# Patient Record
Sex: Female | Born: 1970 | Race: Black or African American | Hispanic: No | Marital: Married | Smoking: Former smoker
Health system: Southern US, Community
[De-identification: ages and names within clinical notes are randomized; demographics above are authoritative.]

## PROBLEM LIST (undated history)

## (undated) ENCOUNTER — Ambulatory Visit

## (undated) DIAGNOSIS — R569 Unspecified convulsions: Secondary | ICD-10-CM

## (undated) DIAGNOSIS — J302 Other seasonal allergic rhinitis: Secondary | ICD-10-CM

## (undated) HISTORY — DX: Other seasonal allergic rhinitis: J30.2

## (undated) HISTORY — PX: CHOLECYSTECTOMY: SHX55

---

## 2000-10-02 ENCOUNTER — Emergency Department (HOSPITAL_COMMUNITY): Admission: EM | Admit: 2000-10-02 | Discharge: 2000-10-02 | Payer: Self-pay | Admitting: Emergency Medicine

## 2009-10-23 ENCOUNTER — Emergency Department (HOSPITAL_COMMUNITY): Admission: EM | Admit: 2009-10-23 | Discharge: 2009-10-23 | Payer: Self-pay | Admitting: Emergency Medicine

## 2011-02-24 LAB — POCT I-STAT, CHEM 8
BUN: 16 mg/dL (ref 6–23)
Chloride: 108 mEq/L (ref 96–112)
Creatinine, Ser: 1.1 mg/dL (ref 0.4–1.2)
Glucose, Bld: 74 mg/dL (ref 70–99)
HCT: 37 % (ref 36.0–46.0)
Potassium: 3.5 mEq/L (ref 3.5–5.1)

## 2011-02-24 LAB — URINALYSIS, ROUTINE W REFLEX MICROSCOPIC
Bilirubin Urine: NEGATIVE
Hgb urine dipstick: NEGATIVE
Specific Gravity, Urine: 1.028 (ref 1.005–1.030)
pH: 6.5 (ref 5.0–8.0)

## 2020-09-12 ENCOUNTER — Other Ambulatory Visit: Payer: Self-pay

## 2020-09-12 ENCOUNTER — Encounter (HOSPITAL_COMMUNITY): Payer: Self-pay | Admitting: Emergency Medicine

## 2020-09-12 ENCOUNTER — Emergency Department (HOSPITAL_COMMUNITY): Payer: Self-pay

## 2020-09-12 ENCOUNTER — Emergency Department (HOSPITAL_COMMUNITY)
Admission: EM | Admit: 2020-09-12 | Discharge: 2020-09-12 | Disposition: A | Discharge status: Home / Self Care | Payer: Self-pay | Attending: Emergency Medicine | Admitting: Emergency Medicine

## 2020-09-12 DIAGNOSIS — R202 Paresthesia of skin: Secondary | ICD-10-CM | POA: Insufficient documentation

## 2020-09-12 DIAGNOSIS — M79604 Pain in right leg: Secondary | ICD-10-CM | POA: Insufficient documentation

## 2020-09-12 DIAGNOSIS — R42 Dizziness and giddiness: Secondary | ICD-10-CM | POA: Insufficient documentation

## 2020-09-12 DIAGNOSIS — H539 Unspecified visual disturbance: Secondary | ICD-10-CM | POA: Insufficient documentation

## 2020-09-12 DIAGNOSIS — Z87891 Personal history of nicotine dependence: Secondary | ICD-10-CM | POA: Insufficient documentation

## 2020-09-12 HISTORY — DX: Unspecified convulsions: R56.9

## 2020-09-12 LAB — COMPREHENSIVE METABOLIC PANEL
ALT: 13 U/L (ref 0–44)
AST: 26 U/L (ref 15–41)
Albumin: 3.1 g/dL — ABNORMAL LOW (ref 3.5–5.0)
Alkaline Phosphatase: 52 U/L (ref 38–126)
Anion gap: 11 (ref 5–15)
BUN: 17 mg/dL (ref 6–20)
CO2: 21 mmol/L — ABNORMAL LOW (ref 22–32)
Calcium: 8.6 mg/dL — ABNORMAL LOW (ref 8.9–10.3)
Chloride: 106 mmol/L (ref 98–111)
Creatinine, Ser: 1.01 mg/dL — ABNORMAL HIGH (ref 0.44–1.00)
GFR, Estimated: >60 mL/min (ref >60)
Glucose, Bld: 108 mg/dL — ABNORMAL HIGH (ref 70–99)
Potassium: 4.4 mmol/L (ref 3.5–5.1)
Sodium: 138 mmol/L (ref 135–145)
Total Bilirubin: 0.6 mg/dL (ref 0.3–1.2)
Total Protein: 6.7 g/dL (ref 6.5–8.1)

## 2020-09-12 LAB — CBC WITH DIFFERENTIAL/PLATELET
Abs Immature Granulocytes: 0.03 10*3/uL (ref 0.00–0.07)
Basophils Absolute: 0 10*3/uL (ref 0.0–0.1)
Basophils Relative: 1 %
Eosinophils Absolute: 0.2 10*3/uL (ref 0.0–0.5)
Eosinophils Relative: 4 %
HCT: 34.1 % — ABNORMAL LOW (ref 36.0–46.0)
Hemoglobin: 10.4 g/dL — ABNORMAL LOW (ref 12.0–15.0)
Immature Granulocytes: 1 %
Lymphocytes Relative: 26 %
Lymphs Abs: 1.4 10*3/uL (ref 0.7–4.0)
MCH: 25.1 pg — ABNORMAL LOW (ref 26.0–34.0)
MCHC: 30.5 g/dL (ref 30.0–36.0)
MCV: 82.4 fL (ref 80.0–100.0)
Monocytes Absolute: 0.7 10*3/uL (ref 0.1–1.0)
Monocytes Relative: 13 %
Neutro Abs: 3.1 10*3/uL (ref 1.7–7.7)
Neutrophils Relative %: 55 %
Platelets: 295 10*3/uL (ref 150–400)
RBC: 4.14 MIL/uL (ref 3.87–5.11)
RDW: 14.6 % (ref 11.5–15.5)
WBC: 5.5 10*3/uL (ref 4.0–10.5)
nRBC: 0 % (ref 0.0–0.2)

## 2020-09-12 LAB — POC URINE PREG, ED: Preg Test, Ur: NEGATIVE

## 2020-09-12 LAB — POC OCCULT BLOOD, ED: Fecal Occult Bld: NEGATIVE

## 2020-09-12 NOTE — ED Triage Notes (Signed)
Pt states she is having numbness on her right leg and dizziness for a month not getting better.

## 2020-09-12 NOTE — ED Notes (Signed)
Dc instructions reviewed with pt. Pt verbalized understanding. Pt DC. 

## 2020-09-12 NOTE — Discharge Instructions (Addendum)
Please follow up with PCP (as instructed) ASAP for referral to Neurology and ophthalmology as discussed.

## 2020-09-12 NOTE — Progress Notes (Signed)
   09/12/20 1840  TOC ED Mini Assessment  TOC Time spent with patient (minutes): 15  PING Used in TOC Assessment Yes  Admission or Readmission Diverted Yes  Interventions which prevented an admission or readmission PCP Appointment Scheduled  ED CM met with patient at bedside to discuss follow up care and establishing primary care.  Patient reports not having health insurance. Discussed [facility] patient is agreeable to receive care at the clinic, CM will forward information to the [facility] at the Clinic to follow up with patient.

## 2020-09-12 NOTE — ED Provider Notes (Signed)
[facility] EMERGENCY DEPARTMENT Provider Note   CSN: [id number] Arrival date & time: 09/12/20  1241     History Chief Complaint  Patient presents with  . Numbness  . Dizziness    Jennifer Spencer is a 49 y.o. female.  The history is provided by the patient.  Dizziness Quality:  Lightheadedness Severity:  Moderate Onset quality:  Sudden Duration: 2 times in last month. Timing:  Intermittent Progression:  Unable to specify Chronicity:  New Relieved by:  Being still Associated symptoms: vision changes   Associated symptoms: no diarrhea, no nausea, no shortness of breath, no vomiting and no weakness   Associated symptoms comment:  R eye vision worsening Leg Pain Location:  Leg Leg location:  R upper leg Pain details:    Quality:  Tingling   Radiates to:  Does not radiate   Severity:  Mild   Onset quality:  Sudden   Timing:  Intermittent Chronicity:  New Associated symptoms: no back pain, no fever, no muscle weakness and no neck pain   Risk factors: obesity        Past Medical History:  Diagnosis Date  . Seizures ([place])     There are no problems to display for this patient.   History reviewed. No pertinent surgical history.   OB History   No obstetric history on file.     No family history on file.  Social History   Tobacco Use  . Smoking status: Former Research scientist (life sciences)  . Smokeless tobacco: Never Used  Substance Use Topics  . Alcohol use: Not Currently  . Drug use: Not Currently    Home Medications Prior to Admission medications   Not on File    Allergies    Codeine, Depakote [divalproex sodium], Penicillins, and Vicodin [hydrocodone-acetaminophen]  Review of Systems   Review of Systems  Constitutional: Negative for fever.  HENT: Negative for sore throat and trouble swallowing.   Eyes: Negative for visual disturbance.  Respiratory: Negative for shortness of breath.   Gastrointestinal: Negative for diarrhea, nausea and  vomiting.  Musculoskeletal: Negative for back pain and neck pain.  Skin: Negative for color change.  Neurological: Positive for dizziness. Negative for weakness.  All other systems reviewed and are negative.   Physical Exam Updated Vital Signs BP 127/67 (BP Location: Right Arm)   Pulse 65   Temp 98.2 F (36.8 C) (Oral)   Resp 16   SpO2 99%   Physical Exam Vitals reviewed.  Constitutional:      General: She is not in acute distress.    Appearance: She is obese.  HENT:     Head: Normocephalic and atraumatic.     Nose: Nose normal.     Mouth/Throat:     Mouth: Mucous membranes are moist.  Eyes:     Comments: Visual acuity R 20/200 and  L 20/40  Cardiovascular:     Rate and Rhythm: Normal rate.     Heart sounds: Normal heart sounds.  Pulmonary:     Effort: Pulmonary effort is normal.     Breath sounds: Normal breath sounds.  Abdominal:     General: Abdomen is flat.     Palpations: Abdomen is soft.     Tenderness: There is no abdominal tenderness.  Musculoskeletal:        General: No swelling, tenderness or signs of injury.     Cervical back: Neck supple.     Right lower leg: No edema.     Left lower leg:  No edema.  Skin:    General: Skin is warm and dry.  Neurological:     Mental Status: She is alert.     Gait: Gait normal.  Psychiatric:        Mood and Affect: Mood normal.        Behavior: Behavior normal.     ED Results / Procedures / Treatments   Labs (all labs ordered are listed, but only abnormal results are displayed) Labs Reviewed  CBC WITH DIFFERENTIAL/PLATELET - Abnormal; Notable for the following components:      Result Value   Hemoglobin 10.4 (*)    HCT 34.1 (*)    MCH 25.1 (*)    All other components within normal limits  COMPREHENSIVE METABOLIC PANEL - Abnormal; Notable for the following components:   CO2 21 (*)    Glucose, Bld 108 (*)    Creatinine, Ser 1.01 (*)    Calcium 8.6 (*)    Albumin 3.1 (*)    All other components within normal  limits  OCCULT BLOOD X 1 CARD TO LAB, STOOL  POC URINE PREG, ED  POC OCCULT BLOOD, ED    EKG EKG Interpretation  Date/Time:  Thursday September 12 2020 15:38:54 EDT Ventricular Rate:  59 PR Interval:    QRS Duration: 99 QT Interval:  403 QTC Calculation: 400 R Axis:   30 Text Interpretation: Sinus rhythm Artifact Low voltage QRS Abnormal ECG Confirmed by Carmin Muskrat [phone]) on 09/12/2020 3:47:28 PM   Radiology CT Head Wo Contrast  Result Date: 09/12/2020 CLINICAL DATA:  Vision loss.  Numbness in right leg. EXAM: CT HEAD WITHOUT CONTRAST TECHNIQUE: Contiguous axial images were obtained from the base of the skull through the vertex without intravenous contrast. COMPARISON:  None. FINDINGS: Brain: No acute intracranial abnormality. Specifically, no hemorrhage, hydrocephalus, mass lesion, acute infarction, or significant intracranial injury. Vascular: No hyperdense vessel or unexpected calcification. Skull: No acute calvarial abnormality. Sinuses/Orbits: Visualized paranasal sinuses and mastoids clear. Orbital soft tissues unremarkable. Other: None IMPRESSION: Normal study. Electronically Signed   By: Rolm Baptise M.D.   On: 09/12/2020 17:42    Procedures Procedures (including critical care time)  Medications Ordered in ED Medications - No data to display  ED Course  I have reviewed the triage vital signs and the nursing notes.  Pertinent labs & imaging results that were available during my care of the patient were reviewed by me and considered in my medical decision making (see chart for details).    MDM Rules/Calculators/A&P                           Medical Decision Making: Jennifer Spencer is a 49 y.o. female who presented to the ED today with R leg numbness and tingling as well as intermittent dizziness/lightheadedness occurring 2 times in the last month associated with new R visual change. Pt reports intermittent R leg pain, "falling asleep sensation" that occurs  after prolonged sitting or standing and improves with position adjustment. No associated back pain, bowel/bladder changes, weakness, or swelling. Pt also notes 2 episodes of lightheadedness with darkening of vision and syncope earlier this month, no identifiable trigger, no associated CP or SOB, episode resolved quickly with rest. Dizziness does not occur other times. Pt reports increased activity and more busy planning her wedding and that the leg pain is slowing her down. Pt additionally notes 1 month of worsened R vision, she describes her vision as converging on itself  and reports she feels like it started around the same time as her first dizziness episode.   Past medical history significant for seizures Reviewed and confirmed nursing documentation for past medical history, family history, social history.  Consults: none performed All radiology and laboratory studies reviewed independently and with my attending physician, agree with reading provided by radiologist unless otherwise noted.  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 Likely in setting of obesity or tight clothing, blood glucose 101 today, no hx diabetes, however high risk with fam hx and weight, advised PCP follow up for HGA1c. Urine preg negative, Pt denies back pain, No red flag back pain symptoms or neurologic deficits. Pt advised to wear loose clothing, continue current weight loss, and use tylenol and ibuprofen as needed. Durward Fortes PCP follow up for dx testing and tx options.  Pt with intermittent dizziness, no SOB or CP, normal electrolytes and EKG.  Mild anemia compared to prior, Hgb 10.4 (from 12.6 in 2010). Pt reports she has a hx of very heavy periods with 7 days or bleeding and 4 days or "super plus" tampons use for high volume bleeding.  FOB negative, pt denies bright red blood or melena in stool.  Pt with 1 month of visual changes in R eye, CT head obtained  unremarkable.  Will refer  to PCP for close referral to ophthalmology and neurology for outpt follow up.  Pt has not seen a PCP in 6 years, advised pt of the importance of establishing PCP follow up for regular preventative health care and monitoring  (HgA1c for DM screening, colonoscopy, pap, etc).    Upon reassessing patient, patient was in NAD, comfortable resting in bed.  Based on the above findings, I believe patient is hemodynamically stable for discharge.  Dispo:  Patient/and family educated about specific return precautions for given chief complaint and symptoms.  Patient/and family educated about follow-up with PCP and given referral to ophthalmologist .  Patient/and family expressed understanding of return precautions and need for follow-up.  Patient discharged.  The above care was discussed with and agreed upon by my attending physician.   Emergency Department Medication Summary:  Medications - No data to display     Final Clinical Impression(s) / ED Diagnoses Final diagnoses:  Paresthesia  Change in vision  Dizziness    Rx / DC Orders ED Discharge Orders    None       Roosevelt Locks, MD 09/12/20 2222    Carmin Muskrat, MD 09/12/20 2323

## 2020-09-26 NOTE — Progress Notes (Deleted)
Patient ID: Jennifer Spencer, female   DOB: [birth year], 49 y.o.   MRN: [id number]   After being seen in the ED 10/21 with leg numbness and vision changes.   From ED A/P: Medical Decision Making: Jennifer Spencer is a 49 y.o. female who presented to the ED today with R leg numbness and tingling as well as intermittent dizziness/lightheadedness occurring 2 times in the last month associated with new R visual change. Pt reports intermittent R leg pain, "falling asleep sensation" that occurs after prolonged sitting or standing and improves with position adjustment. No associated back pain, bowel/bladder changes, weakness, or swelling. Pt also notes 2 episodes of lightheadedness with darkening of vision and syncope earlier this month, no identifiable trigger, no associated CP or SOB, episode resolved quickly with rest. Dizziness does not occur other times. Pt reports increased activity and more busy planning her wedding and that the leg pain is slowing her down. Pt additionally notes 1 month of worsened R vision, she describes her vision as converging on itself and reports she feels like it started around the same time as her first dizziness episode.   Past medical history significant for seizures Reviewed and confirmed nursing documentation for past medical history, family history, social history.  Consults: none performed All radiology and laboratory studies reviewed independently and with my attending physician, agree with reading provided by radiologist unless otherwise noted.  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 Likely in setting of obesity or tight clothing, blood glucose 101 today, no hx diabetes, however high risk with fam hx and weight, advised PCP follow up for HGA1c. Urine preg negative, Pt denies back pain, No red flag back pain symptoms or neurologic deficits. Pt advised to wear loose clothing, continue current weight loss, and use tylenol  and ibuprofen as needed. Durward Fortes PCP follow up for dx testing and tx options.  Pt with intermittent dizziness, no SOB or CP, normal electrolytes and EKG.  Mild anemia compared to prior, Hgb 10.4 (from 12.6 in 2010). Pt reports she has a hx of very heavy periods with 7 days or bleeding and 4 days or "super plus" tampons use for high volume bleeding.  FOB negative, pt denies bright red blood or melena in stool.  Pt with 1 month of visual changes in R eye, CT head obtained  unremarkable.  Will refer to PCP for close referral to ophthalmology and neurology for outpt follow up.  Pt has not seen a PCP in 6 years, advised pt of the importance of establishing PCP follow up for regular preventative health care and monitoring  (HgA1c for DM screening, colonoscopy, pap, etc).    Upon reassessing patient, patient was in NAD, comfortable resting in bed.  Based on the above findings, I believe patient is hemodynamically stable for discharge.  Dispo:  Patient/and family educated about specific return precautions for given chief complaint and symptoms.  Patient/and family educated about follow-up with PCP and given referral to ophthalmologist .  Patient/and family expressed understanding of return precautions and need for follow-up.  Patient discharged.

## 2020-10-02 ENCOUNTER — Inpatient Hospital Stay: Payer: Medicaid Other | Admitting: Physician Assistant

## 2020-10-09 NOTE — Progress Notes (Deleted)
Patient ID: Jennifer Spencer, female   DOB: Jul 22, 1971, 49 y.o.   MRN: [id number]   After ED visit 12/14/2019 for several issues.   From A/P: Medical Decision Making: [name] is a 49 y.o. female who presented to the ED today with R leg numbness and tingling as well as intermittent dizziness/lightheadedness occurring 2 times in the last month associated with new R visual change. Pt reports intermittent R leg pain, "falling asleep sensation" that occurs after prolonged sitting or standing and improves with position adjustment. No associated back pain, bowel/bladder changes, weakness, or swelling. Pt also notes 2 episodes of lightheadedness with darkening of vision and syncope earlier this month, no identifiable trigger, no associated CP or SOB, episode resolved quickly with rest. Dizziness does not occur other times. Pt reports increased activity and more busy planning her wedding and that the leg pain is slowing her down. Pt additionally notes 1 month of worsened R vision, she describes her vision as converging on itself and reports she feels like it started around the same time as her first dizziness episode.   Past medical history significant for seizures Reviewed and confirmed nursing documentation for past medical history, family history, social history.  Consults: none performed All radiology and laboratory studies reviewed independently and with my attending physician, agree with reading provided by radiologist unless otherwise noted.  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 Likely in setting of obesity or tight clothing, blood glucose 101 today, no hx diabetes, however high risk with fam hx and weight, advised PCP follow up for HGA1c. Urine preg negative, Pt denies back pain, No red flag back pain symptoms or neurologic deficits. Pt advised to wear loose clothing, continue current weight loss, and use tylenol and ibuprofen as needed. Durward Fortes PCP follow up for dx testing and tx options.  Pt with intermittent dizziness, no SOB or CP, normal electrolytes and EKG.  Mild anemia compared to prior, Hgb 10.4 (from 12.6 in 2010). Pt reports she has a hx of very heavy periods with 7 days or bleeding and 4 days or "super plus" tampons use for high volume bleeding.  FOB negative, pt denies bright red blood or melena in stool.  Pt with 1 month of visual changes in R eye, CT head obtained  unremarkable.  Will refer to PCP for close referral to ophthalmology and neurology for outpt follow up.  Pt has not seen a PCP in 6 years, advised pt of the importance of establishing PCP follow up for regular preventative health care and monitoring  (HgA1c for DM screening, colonoscopy, pap, etc).

## 2020-10-16 ENCOUNTER — Inpatient Hospital Stay: Payer: Medicaid Other | Admitting: Physician Assistant

## 2020-11-13 NOTE — Progress Notes (Signed)
Patient ID: Jennifer Spencer, female   DOB: [birth year], 49 y.o.   MRN: [id number]     Jennifer Spencer, is a 49 y.o. female  [id number]  [id number]  DOB - [birth year]  Subjective:  Chief Complaint and HPI: Jennifer Spencer is a 49 y.o. female here today to establish care and for a follow up visit ED 09/12/20 for multiple issues.  Her R leg tingling has improved.  She is still working on weight loss.  There is no weight on her in Epic other than today.  She says 11/2019 she weighed 326.  She does admit to drinking 2-3 sodas or teas a day.  She doesn't "like water."  Dizziness is much better.  Vision improved with readers but she wants to see an eye doctor about prescription glasses.  She has never had colonoscopy.  Overdue for pap.  Applying for financial assistance.  Not on iron.  Still has periods monthly that are heavy.    From A/P: Clinical picture or R leg tingling is most consistent with meralgia paresthetica. Likely in setting of obesity or tight clothing, blood glucose 101 today, no hx diabetes, however high risk with fam hx and weight, advised PCP follow up for HGA1c. Urine preg negative, Pt denies back pain, No red flag back pain symptoms or neurologic deficits. Pt advised to wear loose clothing, continue current weight loss, and use tylenol and ibuprofen as needed. Algis Downs. Advised PCP follow up for dx testing and tx options.  Pt with intermittent dizziness, no SOB or CP, normal electrolytes and EKG.  Mild anemia compared to prior, Hgb 10.4 (from 12.6 in 2010). Pt reports she has a hx of very heavy periods with 7 days or bleeding and 4 days or "super plus" tampons use for high volume bleeding.  FOB negative, pt denies bright red blood or melena in stool.  Pt with 1 month of visual changes in R eye, CT head obtained  unremarkable.  Will refer to PCP for close referral to ophthalmology and neurology for outpt follow up.  Pt has not seen a PCP in 6 years, advised pt of the importance  of establishing PCP follow up for regular preventative health care and monitoring  (HgA1c for DM screening, colonoscopy, pap, etc).    Upon reassessing patient, patient was in NAD, comfortable resting in bed.  Based on the above findings, I believe patient is hemodynamically stable for discharge.  Dispo:  Patient/and family educated about specific return precautions for given chief complaint and symptoms.  Patient/and family educated about follow-up with PCP and given referral to ophthalmologist .  Patient/and family expressed    ED/Hospital notes reviewed.   Social:  engaged  ROS:   Constitutional:  No f/c, No night sweats, No unexplained weight loss. EENT:  No vision changes, No blurry vision, No hearing changes. No mouth, throat, or ear problems.  Respiratory: No cough, No SOB Cardiac: No CP, no palpitations GI:  No abd pain, No N/V/D. GU: No Urinary s/sx Musculoskeletal: No joint pain Neuro: No headache, much improved dizziness, no motor weakness.  Skin: No rash Endocrine:  No polydipsia. No polyuria.  Psych: Denies SI/HI  No problems updated.  ALLERGIES: Allergies  Allergen Reactions  . Allegra [Fexofenadine]   . Aspirin   . Bactrim [Sulfamethoxazole-Trimethoprim]   . Codeine   . Depakote [Divalproex Sodium]   . Mushroom Extract Complex   . Penicillins   . Percocet [Oxycodone-Acetaminophen]   . Tequin [Gatifloxacin]   . Vicodin [Hydrocodone-Acetaminophen]  PAST MEDICAL HISTORY: Past Medical History:  Diagnosis Date  . Seizures ([place])     MEDICATIONS AT HOME: Prior to Admission medications   Not on File     Objective:  EXAM:   Vitals:   11/19/20 1030  BP: 121/74  Pulse: 73  Temp: 98.9 F (37.2 C)  TempSrc: Oral  SpO2: 98%  Weight: 278 lb (126.1 kg)  Height: 5\' 7"  (1.702 m)    General appearance : A&OX3. NAD. Non-toxic-appearing HEENT: Atraumatic and Normocephalic.  PERRLA. EOM intact.   Chest/Lungs:  Breathing-non-labored, Good air  entry bilaterally, breath sounds normal without rales, rhonchi, or wheezing  CVS: S1 S2 regular, no murmurs, gallops, rubs  Extremities: Bilateral Lower Ext shows no edema, both legs are warm to touch with = pulse throughout Neurology:  CN II-XII grossly intact, Non focal.   Psych:  TP linear. J/I fair. Normal speech. Appropriate eye contact and affect.  Skin:  No Rash  Data Review No results found for: HGBA1C   Assessment & Plan   1. Hyperglycemia I have had a lengthy discussion and provided education about insulin resistance and the intake of too much sugar/refined carbohydrates.  I have advised the patient to work at a goal of eliminating sugary drinks, candy, desserts, sweets, refined sugars, processed foods, and white carbohydrates.  The patient expresses understanding.  - Hemoglobin A1c  2. Anemia, unspecified type - CBC with Differential/Platelet - Ambulatory referral to Gastroenterology - Iron, TIBC and Ferritin Panel  3. Screening for colon cancer - Ambulatory referral to Gastroenterology  4. Encounter for examination following treatment at hospital Much improved  5. Screening cholesterol level - Lipid panel  6. Vision changes -improved with readers - Ambulatory referral to Ophthalmology  7. Body aches Much improved - Vitamin D, 25-hydroxy     Patient have been counseled extensively about nutrition and exercise  Return in about 6 weeks (around 12/31/2020) for assign to PCP.  The patient was given clear instructions to go to ER or return to medical center if symptoms don't improve, worsen or new problems develop. The patient verbalized understanding. The patient was told to call to get lab results if they haven't heard anything in the next week.     Freeman Caldron, PA-C [facility] [place], [place]   11/19/2020, 11:44 AM

## 2020-11-19 ENCOUNTER — Encounter: Payer: Self-pay | Admitting: Physician Assistant

## 2020-11-19 ENCOUNTER — Ambulatory Visit: Payer: Self-pay | Attending: Physician Assistant | Admitting: Physician Assistant

## 2020-11-19 ENCOUNTER — Other Ambulatory Visit: Payer: Self-pay

## 2020-11-19 VITALS — BP 121/74 | HR 73 | Temp 98.9°F | Ht 67.0 in | Wt 278.0 lb

## 2020-11-19 DIAGNOSIS — D649 Anemia, unspecified: Secondary | ICD-10-CM

## 2020-11-19 DIAGNOSIS — Z13220 Encounter for screening for lipoid disorders: Secondary | ICD-10-CM

## 2020-11-19 DIAGNOSIS — Z09 Encounter for follow-up examination after completed treatment for conditions other than malignant neoplasm: Secondary | ICD-10-CM

## 2020-11-19 DIAGNOSIS — Z1211 Encounter for screening for malignant neoplasm of colon: Secondary | ICD-10-CM

## 2020-11-19 DIAGNOSIS — H539 Unspecified visual disturbance: Secondary | ICD-10-CM

## 2020-11-19 DIAGNOSIS — R52 Pain, unspecified: Secondary | ICD-10-CM

## 2020-11-19 DIAGNOSIS — R739 Hyperglycemia, unspecified: Secondary | ICD-10-CM

## 2020-11-19 NOTE — Patient Instructions (Signed)
Drink 80-100 ounces water daily 

## 2020-11-20 ENCOUNTER — Other Ambulatory Visit: Payer: Self-pay | Admitting: Physician Assistant

## 2020-11-20 LAB — HEMOGLOBIN A1C
Est. average glucose Bld gHb Est-mCnc: 114 mg/dL
Hgb A1c MFr Bld: 5.6 % (ref 4.8–5.6)

## 2020-11-20 LAB — CBC WITH DIFFERENTIAL/PLATELET
Basophils Absolute: 0 10*3/uL (ref 0.0–0.2)
Basos: 0 %
EOS (ABSOLUTE): 0.2 10*3/uL (ref 0.0–0.4)
Eos: 3 %
Hematocrit: 34.2 % (ref 34.0–46.6)
Hemoglobin: 10.9 g/dL — ABNORMAL LOW (ref 11.1–15.9)
Immature Grans (Abs): 0 10*3/uL (ref 0.0–0.1)
Immature Granulocytes: 0 %
Lymphocytes Absolute: 1.9 10*3/uL (ref 0.7–3.1)
Lymphs: 26 %
MCH: 24.9 pg — ABNORMAL LOW (ref 26.6–33.0)
MCHC: 31.9 g/dL (ref 31.5–35.7)
MCV: 78 fL — ABNORMAL LOW (ref 79–97)
Monocytes Absolute: 0.9 10*3/uL (ref 0.1–0.9)
Monocytes: 12 %
Neutrophils Absolute: 4 10*3/uL (ref 1.4–7.0)
Neutrophils: 59 %
Platelets: 333 10*3/uL (ref 150–450)
RBC: 4.37 x10E6/uL (ref 3.77–5.28)
RDW: 15.5 % — ABNORMAL HIGH (ref 11.7–15.4)
WBC: 7 10*3/uL (ref 3.4–10.8)

## 2020-11-20 LAB — IRON,TIBC AND FERRITIN PANEL
Ferritin: 18 ng/mL (ref 15–150)
Iron Saturation: 36 % (ref 15–55)
Iron: 132 ug/dL (ref 27–159)
Total Iron Binding Capacity: 362 ug/dL (ref 250–450)
UIBC: 230 ug/dL (ref 131–425)

## 2020-11-20 LAB — LIPID PANEL
Chol/HDL Ratio: 4.1 ratio (ref 0.0–4.4)
Cholesterol, Total: 216 mg/dL — ABNORMAL HIGH (ref 100–199)
HDL: 53 mg/dL (ref >39)
LDL Chol Calc (NIH): 150 mg/dL — ABNORMAL HIGH (ref 0–99)
Triglycerides: 74 mg/dL (ref 0–149)
VLDL Cholesterol Cal: 13 mg/dL (ref 5–40)

## 2020-11-20 LAB — VITAMIN D 25 HYDROXY (VIT D DEFICIENCY, FRACTURES): Vit D, 25-Hydroxy: 33.4 ng/mL (ref 30.0–100.0)

## 2020-11-20 MED ORDER — ATORVASTATIN CALCIUM 20 MG PO TABS: 20.0000 mg | ORAL_TABLET | Freq: Every day | ORAL | 3 refills | Status: DC

## 2020-11-21 ENCOUNTER — Telehealth (INDEPENDENT_AMBULATORY_CARE_PROVIDER_SITE_OTHER): Payer: Self-pay

## 2020-11-21 NOTE — Telephone Encounter (Signed)
-----   Message from Anders Simmonds, [place] sent at 11/20/2020  8:45 AM EST ----- Your hemoglobin and iron stores are still a little low and will contribute to fatigue/low energy.  If you are taking your iron once daily, increase to taking it twice daily.  Drink 80-100 ounces water daily as we discussed.  Cholesterol is high and you will need medication for this.  I will send a prescription to our pharmacy for this.  Your blood sugars are mildly elevated but you do not have diabetes at this time.  Continue to eliminate sugar from your diet and keep up the weight loss to avoid diabetes.  Follow up as planned. Thanks, Georgian Co, PA-C

## 2020-11-21 NOTE — Telephone Encounter (Signed)
Spoke with patient. She verified date of birth. She is aware that her hemoglobin and iron stores are still a little low and will contribute to fatigue/low energy. If you are taking your iron once daily, increase to taking it twice daily. Drink 80-100 ounces water daily as we discussed. Cholesterol is high and you will need medication for this. I will send a prescription to our pharmacy for this. Your blood sugars are mildly elevated but you do not have diabetes at this time. Continue to eliminate sugar from your diet and keep up the weight loss to avoid diabetes. She is aware of appointment with Dr. Delford Field on 01/01/21. She verbalized understanding of results. Maryjean Morn, CMA

## 2020-11-25 ENCOUNTER — Telehealth: Payer: Self-pay | Admitting: General Practice

## 2020-11-25 NOTE — Telephone Encounter (Signed)
Patient is calling to reschedule her [facility] appt on 11/28/19. Please advise Cb- [phone]

## 2020-11-26 NOTE — Telephone Encounter (Signed)
I return Pt call, LVM Pt already has a financial appt for 11/27/20

## 2020-11-27 ENCOUNTER — Ambulatory Visit: Payer: Medicaid Other

## 2020-11-28 ENCOUNTER — Encounter: Payer: Self-pay | Admitting: Gastroenterology

## 2020-12-06 ENCOUNTER — Ambulatory Visit: Payer: Medicaid Other

## 2020-12-11 ENCOUNTER — Ambulatory Visit: Payer: Medicaid Other

## 2020-12-17 ENCOUNTER — Ambulatory Visit: Payer: Medicaid Other | Admitting: Gastroenterology

## 2020-12-18 ENCOUNTER — Emergency Department (HOSPITAL_COMMUNITY)
Admission: EM | Admit: 2020-12-18 | Discharge: 2020-12-19 | Disposition: A | Discharge status: Home / Self Care | Payer: HRSA Program | Attending: Emergency Medicine | Admitting: Emergency Medicine

## 2020-12-18 ENCOUNTER — Encounter (HOSPITAL_COMMUNITY): Payer: Self-pay

## 2020-12-18 ENCOUNTER — Emergency Department (HOSPITAL_COMMUNITY): Payer: HRSA Program

## 2020-12-18 DIAGNOSIS — U071 COVID-19: Secondary | ICD-10-CM | POA: Diagnosis not present

## 2020-12-18 DIAGNOSIS — R112 Nausea with vomiting, unspecified: Secondary | ICD-10-CM

## 2020-12-18 DIAGNOSIS — R509 Fever, unspecified: Secondary | ICD-10-CM | POA: Diagnosis present

## 2020-12-18 DIAGNOSIS — Z87891 Personal history of nicotine dependence: Secondary | ICD-10-CM | POA: Insufficient documentation

## 2020-12-18 LAB — URINALYSIS, ROUTINE W REFLEX MICROSCOPIC
Bilirubin Urine: NEGATIVE
Glucose, UA: NEGATIVE mg/dL
Hgb urine dipstick: NEGATIVE
Ketones, ur: NEGATIVE mg/dL
Leukocytes,Ua: NEGATIVE
Nitrite: NEGATIVE
Protein, ur: NEGATIVE mg/dL
Specific Gravity, Urine: 1.015 (ref 1.005–1.030)
pH: 7 (ref 5.0–8.0)

## 2020-12-18 LAB — CBC
HCT: 38.5 % (ref 36.0–46.0)
Hemoglobin: 12.3 g/dL (ref 12.0–15.0)
MCH: 25.6 pg — ABNORMAL LOW (ref 26.0–34.0)
MCHC: 31.9 g/dL (ref 30.0–36.0)
MCV: 80.2 fL (ref 80.0–100.0)
Platelets: 259 10*3/uL (ref 150–400)
RBC: 4.8 MIL/uL (ref 3.87–5.11)
RDW: 14.8 % (ref 11.5–15.5)
WBC: 5.8 10*3/uL (ref 4.0–10.5)
nRBC: 0 % (ref 0.0–0.2)

## 2020-12-18 LAB — COMPREHENSIVE METABOLIC PANEL
ALT: 20 U/L (ref 0–44)
AST: 23 U/L (ref 15–41)
Albumin: 3.4 g/dL — ABNORMAL LOW (ref 3.5–5.0)
Alkaline Phosphatase: 61 U/L (ref 38–126)
Anion gap: 9 (ref 5–15)
BUN: 12 mg/dL (ref 6–20)
CO2: 19 mmol/L — ABNORMAL LOW (ref 22–32)
Calcium: 8.4 mg/dL — ABNORMAL LOW (ref 8.9–10.3)
Chloride: 107 mmol/L (ref 98–111)
Creatinine, Ser: 1.05 mg/dL — ABNORMAL HIGH (ref 0.44–1.00)
GFR, Estimated: >60 mL/min (ref >60)
Glucose, Bld: 111 mg/dL — ABNORMAL HIGH (ref 70–99)
Potassium: 3.9 mmol/L (ref 3.5–5.1)
Sodium: 135 mmol/L (ref 135–145)
Total Bilirubin: 0.2 mg/dL — ABNORMAL LOW (ref 0.3–1.2)
Total Protein: 7.5 g/dL (ref 6.5–8.1)

## 2020-12-18 LAB — I-STAT BETA HCG BLOOD, ED (MC, WL, AP ONLY): I-stat hCG, quantitative: <5 m[IU]/mL (ref <5)

## 2020-12-18 LAB — SARS CORONAVIRUS 2 BY RT PCR (HOSPITAL ORDER, PERFORMED IN ~~LOC~~ HOSPITAL LAB): SARS Coronavirus 2: POSITIVE — AB

## 2020-12-18 LAB — LIPASE, BLOOD: Lipase: 26 U/L (ref 11–51)

## 2020-12-18 MED ORDER — ONDANSETRON 4 MG PO TBDP
4.0000 mg | ORAL_TABLET | Freq: Once | ORAL | Status: AC
  Administered 2020-12-18: 4 mg via ORAL
  Filled 2020-12-18: qty 1

## 2020-12-18 MED ORDER — IBUPROFEN 400 MG PO TABS
400.0000 mg | ORAL_TABLET | Freq: Once | ORAL | Status: AC
  Administered 2020-12-18: 400 mg via ORAL
  Filled 2020-12-18: qty 1

## 2020-12-18 NOTE — ED Triage Notes (Signed)
Pt comes via [facility] for sob, fever,n/v/d x 6 days.

## 2020-12-18 NOTE — ED Provider Notes (Signed)
[facility] EMERGENCY DEPARTMENT Provider Note   CSN: [id number] Arrival date & time: 12/18/20  1920     History Chief Complaint  Patient presents with  . Shortness of Breath    Jennifer Spencer is a 50 y.o. female.  50 year old female presents to the emergency department for URI symptoms and subjective fever which began on 12/12/2020.  She noticed increased fatigue, malaise, subjective fever.  This was associated with sinus pressure as well as nausea.  Her symptoms were managed supportively and she began to have some symptomatic improvement come Saturday.  Today began to have worsening nausea.  Has been vomiting multiple times throughout the day, unable to tolerate food or fluids.  No medications taken today for vomiting.  Has felt cold and hot, but no fever in the past few days, per patient.  She has been vaccinated for COVID-19 but has not received her booster.  Unknown sick contacts.  The history is provided by the patient. No language interpreter was used.  Shortness of Breath      Past Medical History:  Diagnosis Date  . Seizures ([place])     There are no problems to display for this patient.   History reviewed. No pertinent surgical history.   OB History   No obstetric history on file.     No family history on file.  Social History   Tobacco Use  . Smoking status: Former Research scientist (life sciences)  . Smokeless tobacco: Never Used  Substance Use Topics  . Alcohol use: Not Currently  . Drug use: Not Currently    Home Medications Prior to Admission medications   Medication Sig Start Date End Date Taking? Authorizing Provider  ondansetron (ZOFRAN ODT) 4 MG disintegrating tablet Take 1 tablet (4 mg total) by mouth every 8 (eight) hours as needed for nausea or vomiting. 12/19/20  Yes Antonietta Breach, PA-C  atorvastatin (LIPITOR) 20 MG tablet Take 1 tablet (20 mg total) by mouth daily. 11/20/20   Argentina Donovan, PA-C    Allergies    Allegra [fexofenadine],  Aspirin, Bactrim [sulfamethoxazole-trimethoprim], Codeine, Depakote [divalproex sodium], Mushroom extract complex, Penicillins, Percocet [oxycodone-acetaminophen], Tequin [gatifloxacin], and Vicodin [hydrocodone-acetaminophen]  Review of Systems   Review of Systems  Respiratory: Positive for shortness of breath.   Ten systems reviewed and are negative for acute change, except as noted in the HPI.    Physical Exam Updated Vital Signs BP 116/70   Pulse 72   Temp 98.4 F (36.9 C) (Oral)   Resp 19   LMP 11/04/2020 (LMP Unknown)   SpO2 99%   Physical Exam Vitals and nursing note reviewed.  Constitutional:      General: She is not in acute distress.    Appearance: She is well-developed and well-nourished. She is not diaphoretic.     Comments: Nontoxic appearing and in NAD  HENT:     Head: Normocephalic and atraumatic.  Eyes:     General: No scleral icterus.    Extraocular Movements: EOM normal.     Conjunctiva/sclera: Conjunctivae normal.  Cardiovascular:     Rate and Rhythm: Normal rate and regular rhythm.     Pulses: Normal pulses.  Pulmonary:     Effort: Pulmonary effort is normal. No respiratory distress.     Breath sounds: No stridor. No wheezing or rales.     Comments: Respirations even and unlabored Abdominal:     Comments: Soft, obese, nondistended.  Musculoskeletal:        General: Normal range of motion.  Cervical back: Normal range of motion.  Skin:    General: Skin is warm and dry.     Coloration: Skin is not pale.     Findings: No erythema or rash.  Neurological:     Mental Status: She is alert and oriented to person, place, and time.     Coordination: Coordination normal.  Psychiatric:        Mood and Affect: Mood and affect normal.        Behavior: Behavior normal.     ED Results / Procedures / Treatments   Labs (all labs ordered are listed, but only abnormal results are displayed) Labs Reviewed  SARS CORONAVIRUS 2 BY RT PCR (HOSPITAL ORDER,  [facility]) - Abnormal; Notable for the following components:      Result Value   SARS Coronavirus 2 POSITIVE (*)    All other components within normal limits  COMPREHENSIVE METABOLIC PANEL - Abnormal; Notable for the following components:   CO2 19 (*)    Glucose, Bld 111 (*)    Creatinine, Ser 1.05 (*)    Calcium 8.4 (*)    Albumin 3.4 (*)    Total Bilirubin 0.2 (*)    All other components within normal limits  CBC - Abnormal; Notable for the following components:   MCH 25.6 (*)    All other components within normal limits  LIPASE, BLOOD  URINALYSIS, ROUTINE W REFLEX MICROSCOPIC  I-STAT BETA HCG BLOOD, ED ([facility], [facility], [facility] ONLY)    EKG EKG Interpretation  Date/Time:  Wednesday December 18 2020 19:44:31 EST Ventricular Rate:  94 PR Interval:  148 QRS Duration: 80 QT Interval:  336 QTC Calculation: 420 R Axis:   29 Text Interpretation: Normal sinus rhythm Normal ECG Confirmed by Addison Lank [phone]) on 12/18/2020 11:26:21 PM   Radiology DG Chest Portable 1 View  Result Date: 12/18/2020 CLINICAL DATA:  50 year old female with shortness of breath and fever for 6 days. EXAM: PORTABLE CHEST 1 VIEW COMPARISON:  None. FINDINGS: The cardiomediastinal silhouette is unremarkable. There is no evidence of focal airspace disease, pulmonary edema, suspicious pulmonary nodule/mass, pleural effusion, or pneumothorax. No acute bony abnormalities are identified. IMPRESSION: No active disease. Electronically Signed   By: Margarette Canada M.D.   On: 12/18/2020 20:11    Procedures Procedures   Medications Ordered in ED Medications  ibuprofen (ADVIL) tablet 400 mg (400 mg Oral Given 12/18/20 2318)  ondansetron (ZOFRAN-ODT) disintegrating tablet 4 mg (4 mg Oral Given 12/18/20 2308)    ED Course  I have reviewed the triage vital signs and the nursing notes.  Pertinent labs & imaging results that were available during my care of the patient were reviewed by me and considered  in my medical decision making (see chart for details).    MDM Rules/Calculators/A&P                          50 year old female presenting for nausea and vomiting in the setting of COVID-19 diagnosis.  While triage note references shortness of breath, she has no complaints of this during my encounter with her.  No tachypnea, dyspnea, hypoxia.  Saturations 100% on room air.  She has not had any recurrent nausea or vomiting while in the waiting room.  Was given Zofran in the ED and is tolerating oral fluids.  Discussed supportive care instructions.  Advised to return if emesis worsens.  Patient discharged in stable condition with no unaddressed concerns.  Daley Mooradian was evaluated in Emergency Department on 12/19/2020 for the symptoms described in the history of present illness. She was evaluated in the context of the global COVID-19 pandemic, which necessitated consideration that the patient might be at risk for infection with the SARS-CoV-2 virus that causes COVID-19. Institutional protocols and algorithms that pertain to the evaluation of patients at risk for COVID-19 are in a state of rapid change based on information released by regulatory bodies including the CDC and federal and state organizations. These policies and algorithms were followed during the patient's care in the ED.   Final Clinical Impression(s) / ED Diagnoses Final diagnoses:  SEGBT-51 virus infection  Non-intractable vomiting with nausea, unspecified vomiting type    Rx / DC Orders ED Discharge Orders         Ordered    ondansetron (ZOFRAN ODT) 4 MG disintegrating tablet  Every 8 hours PRN        12/19/20 0025           Antonietta Breach, PA-C 12/19/20 0155    Fatima Blank, MD 12/19/20 [phone]

## 2020-12-19 ENCOUNTER — Other Ambulatory Visit (HOSPITAL_COMMUNITY): Payer: Self-pay | Admitting: Emergency Medicine

## 2020-12-19 MED ORDER — ONDANSETRON 4 MG PO TBDP: 4.0000 mg | ORAL_TABLET | Freq: Three times a day (TID) | ORAL | 0 refills | Status: DC | PRN

## 2020-12-19 NOTE — Discharge Instructions (Addendum)
You have been diagnosed with COVID 19 today. Avoid fried foods, fatty foods, greasy foods, and milk products until symptoms resolve. Drink plenty of clear liquids. We recommend the use of Zofran as prescribed for nausea/vomiting. Follow-up with your primary care doctor to ensure resolution of symptoms.

## 2020-12-20 ENCOUNTER — Ambulatory Visit: Payer: Medicaid Other

## 2021-01-01 ENCOUNTER — Ambulatory Visit: Payer: Medicaid Other | Admitting: Critical Care Medicine

## 2021-01-01 NOTE — Progress Notes (Deleted)
   Subjective:    Patient ID: Jennifer Spencer, female    DOB: [birth year], 50 y.o.   MRN: [id number]  50 y.o. F here to est PCP  01/01/2021 Pt just in ED for Covid:  50 year old female presents to the emergency department for URI symptoms and subjective fever which began on 12/12/2020.  She noticed increased fatigue, malaise, subjective fever.  This was associated with sinus pressure as well as nausea.  Her symptoms were managed supportively and she began to have some symptomatic improvement come Saturday.  Today began to have worsening nausea.  Has been vomiting multiple times throughout the day, unable to tolerate food or fluids.  No medications taken today for vomiting.  Has felt cold and hot, but no fever in the past few days, per patient.  She has been vaccinated for COVID-19 but has not received her booster.  Unknown sick contacts.  She is unvaccinated.   50 year old female presenting for nausea and vomiting in the setting of COVID-19 diagnosis.  While triage note references shortness of breath, she has no complaints of this during my encounter with her.  No tachypnea, dyspnea, hypoxia.  Saturations 100% on room air.  She has not had any recurrent nausea or vomiting while in the waiting room.  Was given Zofran in the ED and is tolerating oral fluids.  Discussed supportive care instructions.  Advised to return if emesis worsens.  Patient discharged in stable condition with no unaddressed concerns     Review of Systems     Objective:   Physical Exam        Assessment & Plan:

## 2021-01-03 ENCOUNTER — Ambulatory Visit: Payer: Medicaid Other

## 2021-01-06 ENCOUNTER — Other Ambulatory Visit: Payer: Self-pay

## 2021-01-06 ENCOUNTER — Encounter (HOSPITAL_COMMUNITY): Payer: Self-pay | Admitting: *Deleted

## 2021-01-06 ENCOUNTER — Emergency Department (HOSPITAL_COMMUNITY)
Admission: EM | Admit: 2021-01-06 | Discharge: 2021-01-06 | Disposition: A | Discharge status: Home / Self Care | Payer: Medicaid Other | Attending: Emergency Medicine | Admitting: Emergency Medicine

## 2021-01-06 DIAGNOSIS — Z5321 Procedure and treatment not carried out due to patient leaving prior to being seen by health care provider: Secondary | ICD-10-CM | POA: Insufficient documentation

## 2021-01-06 DIAGNOSIS — X58XXXA Exposure to other specified factors, initial encounter: Secondary | ICD-10-CM | POA: Insufficient documentation

## 2021-01-06 DIAGNOSIS — T192XXA Foreign body in vulva and vagina, initial encounter: Secondary | ICD-10-CM | POA: Insufficient documentation

## 2021-01-06 DIAGNOSIS — R109 Unspecified abdominal pain: Secondary | ICD-10-CM | POA: Insufficient documentation

## 2021-01-06 NOTE — ED Notes (Signed)
Patient stated that she was leaving, and left.

## 2021-01-06 NOTE — ED Triage Notes (Signed)
Pt reports having a tampon break last week when trying to remove it. Pt now having mid abd pain. No acute distress is noted.

## 2021-06-06 ENCOUNTER — Emergency Department (HOSPITAL_COMMUNITY): Payer: Medicaid Other

## 2021-06-06 ENCOUNTER — Encounter (HOSPITAL_COMMUNITY): Payer: Self-pay | Admitting: *Deleted

## 2021-06-06 ENCOUNTER — Emergency Department (HOSPITAL_COMMUNITY)
Admission: EM | Admit: 2021-06-06 | Discharge: 2021-06-06 | Disposition: A | Discharge status: Home / Self Care | Payer: Medicaid Other | Attending: Emergency Medicine | Admitting: Emergency Medicine

## 2021-06-06 ENCOUNTER — Other Ambulatory Visit: Payer: Self-pay

## 2021-06-06 DIAGNOSIS — H60502 Unspecified acute noninfective otitis externa, left ear: Secondary | ICD-10-CM

## 2021-06-06 DIAGNOSIS — M25572 Pain in left ankle and joints of left foot: Secondary | ICD-10-CM | POA: Insufficient documentation

## 2021-06-06 DIAGNOSIS — Z87891 Personal history of nicotine dependence: Secondary | ICD-10-CM | POA: Insufficient documentation

## 2021-06-06 DIAGNOSIS — H6092 Unspecified otitis externa, left ear: Secondary | ICD-10-CM | POA: Insufficient documentation

## 2021-06-06 DIAGNOSIS — M25472 Effusion, left ankle: Secondary | ICD-10-CM | POA: Insufficient documentation

## 2021-06-06 MED ORDER — OFLOXACIN 0.3 % OP SOLN
5.0000 [drp] | Freq: Every day | OPHTHALMIC | Status: DC
  Administered 2021-06-06: 5 [drp] via OTIC
  Filled 2021-06-06: qty 5

## 2021-06-06 MED ORDER — CEFDINIR 300 MG PO CAPS
300.0000 mg | ORAL_CAPSULE | Freq: Two times a day (BID) | ORAL | 0 refills | Status: AC
  Filled 2021-06-06: qty 14, 7d supply, fill #0

## 2021-06-06 MED ORDER — KETOROLAC TROMETHAMINE 60 MG/2ML IM SOLN
60.0000 mg | Freq: Once | INTRAMUSCULAR | Status: AC
  Administered 2021-06-06: 60 mg via INTRAMUSCULAR
  Filled 2021-06-06: qty 2

## 2021-06-06 MED ORDER — OFLOXACIN 0.3 % OT SOLN
5.0000 [drp] | Freq: Two times a day (BID) | OTIC | 0 refills | Status: AC
  Filled 2021-06-06: qty 3.5, 7d supply, fill #0

## 2021-06-06 NOTE — Discharge Instructions (Addendum)
You were evaluated in the Emergency Department and after careful evaluation, we did not find any emergent condition requiring admission or further testing in the hospital.  Please take the antibiotics as directed until finished.  Return to the ER for any new or worsening symptoms.  We encourage you to follow up with a primary care provider.  Thank you for allowing [place] to be a part of your care.

## 2021-06-06 NOTE — Progress Notes (Signed)
Orthopedic Tech Progress Note Patient Details:  Jennifer Spencer [birth year] [id number]  Ortho Devices Type of Ortho Device: CAM walker Ortho Device/Splint Location: LLE Ortho Device/Splint Interventions: Ordered, Application, Adjustment   Post Interventions Patient Tolerated: Fair Instructions Provided: Adjustment of device, Care of device, Poper ambulation with device  Jennifer Spencer 06/06/2021, 9:38 PM

## 2021-06-06 NOTE — ED Triage Notes (Signed)
The pt is c/o lt ear pain for 3-4 days with popping  she is also c/o lt ankle pain for 2-3 weeks

## 2021-06-06 NOTE — ED Provider Notes (Signed)
Emergency Medicine Provider Triage Evaluation Note  Jennifer Spencer , a 50 y.o. female  was evaluated in triage.  Pt complains of left ankle/knee pain and left ear pain.  Left ankle and knee pain has been present over the last 2 weeks.  States that the symptoms occurred after she suffered a fall.  Patient reports rolling her ankle during.  Since then she has had constant pain that is worse with weightbearing or movement.  Patient has been elevating legs, applying ice and heat with minimal improvement in symptoms.  Patient complains of pain to left ear.  Patient states he has had this pain over the last 3 to 4 days.  Patient felt a popping sensation and ringing in her ears.  Patient had the symptoms again this morning and was were then followed by ear drainage.  Patient denies any hearing loss.  Review of Systems  Positive: Arthralgia, otalgia, otorrhea Negative: Fever, chills, neck pain, neck stiffness  Physical Exam  BP 125/73   Pulse 75   Temp 98.4 F (36.9 C) (Oral)   Resp 16   Ht 5\' 7"  (1.702 m)   Wt 126.1 kg   SpO2 99%   BMI 43.54 kg/m  Gen:   Awake, no distress   Resp:  Normal effort  MSK:   Moves extremities without difficulty, diffuse tenderness to left ankle.  Decreased range of motion to left ankle secondary to complaints of pain diffuse tenderness to left knee.  Minimal ecchymosis distal to left knee.  Patient has full range of motion of left knee.  Sensation intact to all digits of left foot.  Cap refill less than 3 seconds in all digits of left foot. Other:  No mastoid tenderness, left TM ruptured  Medical Decision Making  Medically screening exam initiated at 3:28 PM.  Appropriate orders placed.  Genine Beckett was informed that the remainder of the evaluation will be completed by another provider, this initial triage assessment does not replace that evaluation, and the importance of remaining in the ED until their evaluation is complete.  The patient appears  stable so that the remainder of the work up may be completed by another provider.      Loni Beckwith, PA-C 06/06/21 [place], [place], DO 06/06/21 1622

## 2021-06-06 NOTE — ED Provider Notes (Signed)
[facility] EMERGENCY DEPARTMENT Provider Note   CSN: [id number] Arrival date & time: 06/06/21  1419     History Chief Complaint  Patient presents with   Otalgia   Ankle Pain    Jennifer Spencer is a 50 y.o. female.  HPI 50 year old female with a history of seizures presents to the ER with multiple complaints.  Patient states that she has had left ear pain which has been ongoing for the last few days.  She states she has felt a pop several times, and she has had some brownish fluid coming out of the ear.  She denies any recent swimming.  She denies any fevers or chills.  She can still hear out of the ear.  She denies any history of diabetes.  She also complains of left ankle pain, patient states that she fell about 3 weeks ago, and has had pain in both of her ankles.  Right ankle has improved, however she continues to have pain and swelling to the left ankle.  Denies any numbness or tingling.  She has been taking ibuprofen at home for pain.    Past Medical History:  Diagnosis Date   Seizures ([place])     There are no problems to display for this patient.   History reviewed. No pertinent surgical history.   OB History   No obstetric history on file.     No family history on file.  Social History   Tobacco Use   Smoking status: Former   Smokeless tobacco: Never  Substance Use Topics   Alcohol use: Not Currently   Drug use: Not Currently    Home Medications Prior to Admission medications   Medication Sig Start Date End Date Taking? Authorizing Provider  cefdinir (OMNICEF) 300 MG capsule Take 1 capsule (300 mg total) by mouth 2 (two) times daily for 7 days. 06/06/21 06/13/21 Yes Garald Balding, PA-C  ofloxacin (FLOXIN) 0.3 % OTIC solution Place 5 drops into the left ear 2 (two) times daily for 7 days. 06/06/21 06/13/21 Yes Garald Balding, PA-C  atorvastatin (LIPITOR) 20 MG tablet Take 1 tablet (20 mg total) by mouth daily. 11/20/20   Argentina Donovan, PA-C  ondansetron (ZOFRAN-ODT) 4 MG disintegrating tablet TAKE 1 TABLET (4 MG TOTAL) BY MOUTH EVERY 8 (EIGHT) HOURS AS NEEDED FOR NAUSEA OR VOMITING. 12/19/20 12/19/21  Antonietta Breach, PA-C    Allergies    Allegra [fexofenadine], Aspirin, Bactrim [sulfamethoxazole-trimethoprim], Codeine, Depakote [divalproex sodium], Mushroom extract complex, Penicillins, Percocet [oxycodone-acetaminophen], Tequin [gatifloxacin], and Vicodin [hydrocodone-acetaminophen]  Review of Systems   Review of Systems Ten systems reviewed and are negative for acute change, except as noted in the HPI.   Physical Exam Updated Vital Signs BP 125/73   Pulse 75   Temp 98.4 F (36.9 C) (Oral)   Resp 16   Ht 5\' 7"  (1.702 m)   Wt 126.1 kg   SpO2 99%   BMI 43.54 kg/m   Physical Exam Vitals and nursing note reviewed.  Constitutional:      General: She is not in acute distress.    Appearance: She is well-developed. She is not ill-appearing or diaphoretic.  HENT:     Head: Normocephalic and atraumatic.     Ears:     Comments: Left ear with tragal and auricular tenderness, no mastoid tenderness.  Left ear canal with visible debris, TM poorly visible. Eyes:     Conjunctiva/sclera: Conjunctivae normal.  Cardiovascular:     Rate and Rhythm:  Normal rate and regular rhythm.     Heart sounds: No murmur heard. Pulmonary:     Effort: Pulmonary effort is normal. No respiratory distress.     Breath sounds: Normal breath sounds.  Abdominal:     Palpations: Abdomen is soft.     Tenderness: There is no abdominal tenderness.  Musculoskeletal:        General: No tenderness or deformity. Normal range of motion.     Cervical back: Neck supple.     Comments: Left ankle with mild effusion, full flexion extension, no overlying warmth, erythema.  Some generalized tenderness around the left ankle.  2+ DP pulses.  Sensations intact.  Skin:    General: Skin is warm and dry.  Neurological:     General: No focal deficit present.      Mental Status: She is alert and oriented to person, place, and time.     Sensory: No sensory deficit.     Motor: No weakness.    ED Results / Procedures / Treatments   Labs (all labs ordered are listed, but only abnormal results are displayed) Labs Reviewed - No data to display  EKG None  Radiology DG Ankle Complete Left  Result Date: 06/06/2021 CLINICAL DATA:  Pain, fall. EXAM: LEFT ANKLE COMPLETE - 3+ VIEW COMPARISON:  None. FINDINGS: Ankle swelling. No evidence of acute fracture. No clear evidence of malalignment; however, evaluation is limited due to patient positioning. Plantar calcaneal enthesophyte. IMPRESSION: 1. No evidence of acute fracture. 2. No clear evidence of malalignment; however, evaluation is limited due to patient positioning. 3. Ankle swelling. Electronically Signed   By: Margaretha Sheffield MD   On: 06/06/2021 16:28   DG Knee Complete 4 Views Left  Result Date: 06/06/2021 CLINICAL DATA:  Recent fall, pain EXAM: LEFT KNEE - COMPLETE 4+ VIEW COMPARISON:  None. FINDINGS: Alignment is anatomic. No acute fracture. Minor marginal spurring and joint space narrowing. No definite joint effusion with technically suboptimal evaluation. IMPRESSION: No acute fracture. No definite joint effusion with technically suboptimal evaluation. Minor degenerative changes. Electronically Signed   By: Macy Mis M.D.   On: 06/06/2021 16:26    Procedures Procedures   Medications Ordered in ED Medications  ofloxacin (OCUFLOX) 0.3 % ophthalmic solution 5 drop (5 drops Left EAR Given 06/06/21 2127)  ketorolac (TORADOL) injection 60 mg (60 mg Intramuscular Given 06/06/21 2059)    ED Course  I have reviewed the triage vital signs and the nursing notes.  Pertinent labs & imaging results that were available during my care of the patient were reviewed by me and considered in my medical decision making (see chart for details).    MDM Rules/Calculators/A&P                           50 year old female with left ear pain and left ankle pain.  On arrival, she is well-appearing, no acute distress, resting comfortably in ER bed.  Vitals overall reassuring, afebrile, not tachycardic, tachypneic hypoxic.  Physical exam of the left ear reveals significant discharge, tragal tenderness, no mastoid tenderness.  Likely suggestive of otitis externa.  TM difficult to visualize.  Patient has a listed allergy to gatifloxacin.  When asked, as patient states that she does not member taking this medication which reaction she had to this.  Patient did have a significant mount of debris in her ear canal and it is difficult to assess if her TM is intact or not.  I  had an extensive discussion with pharmacy about our options.  She does have a listed penicillin allergy, will start her on oral cefdinir as per discussion with pharmacy.  I explained to the patient that otitis externa is treated better with drops, she opted to test drops of ofloxacin in her ear here in the ER.  Patient was observed for about 40 minutes afterwards, tolerated this well.  No evidence of anaphylactic reaction.  Will prescribe ofloxacin for otitis externa.     Left ankle x-rays with swelling, no evidence of acute fracture, the alignment is difficult to evaluate.  Cannot rule out ligamentous injury, however no signs of septic joint, gout, no visible deformities.  Will be given Toradol here, ankle brace.  Supportive care discussed.  Encouraged PCP follow-up if her symptoms continue.  We discussed return precautions.  She was understanding and is agreeable.  Stable for discharge. Final Clinical Impression(s) / ED Diagnoses Final diagnoses:  Acute left ankle pain  Acute otitis externa of left ear, unspecified type    Rx / DC Orders ED Discharge Orders          Ordered    cefdinir (OMNICEF) 300 MG capsule  2 times daily        06/06/21 2044    ofloxacin (FLOXIN) 0.3 % OTIC solution  2 times daily        06/06/21 2157              Lyndel Safe 06/06/21 2158    Wyvonnia Dusky, MD 06/07/21 1045

## 2021-06-09 ENCOUNTER — Other Ambulatory Visit: Payer: Self-pay

## 2021-06-10 ENCOUNTER — Other Ambulatory Visit: Payer: Self-pay

## 2021-06-12 ENCOUNTER — Other Ambulatory Visit: Payer: Self-pay

## 2021-06-13 ENCOUNTER — Other Ambulatory Visit: Payer: Self-pay

## 2022-02-02 ENCOUNTER — Encounter (HOSPITAL_COMMUNITY): Payer: Self-pay

## 2022-02-02 ENCOUNTER — Emergency Department (HOSPITAL_COMMUNITY)
Admission: EM | Admit: 2022-02-02 | Discharge: 2022-02-02 | Disposition: A | Discharge status: Home / Self Care | Payer: Self-pay | Attending: Emergency Medicine | Admitting: Emergency Medicine

## 2022-02-02 ENCOUNTER — Other Ambulatory Visit: Payer: Self-pay

## 2022-02-02 ENCOUNTER — Emergency Department (HOSPITAL_COMMUNITY): Payer: Self-pay

## 2022-02-02 DIAGNOSIS — D509 Iron deficiency anemia, unspecified: Secondary | ICD-10-CM | POA: Insufficient documentation

## 2022-02-02 DIAGNOSIS — R1084 Generalized abdominal pain: Secondary | ICD-10-CM

## 2022-02-02 DIAGNOSIS — D259 Leiomyoma of uterus, unspecified: Secondary | ICD-10-CM | POA: Insufficient documentation

## 2022-02-02 DIAGNOSIS — R000 Tachycardia, unspecified: Secondary | ICD-10-CM | POA: Insufficient documentation

## 2022-02-02 DIAGNOSIS — R112 Nausea with vomiting, unspecified: Secondary | ICD-10-CM | POA: Insufficient documentation

## 2022-02-02 DIAGNOSIS — Z20822 Contact with and (suspected) exposure to covid-19: Secondary | ICD-10-CM | POA: Insufficient documentation

## 2022-02-02 LAB — URINALYSIS, ROUTINE W REFLEX MICROSCOPIC
Bilirubin Urine: NEGATIVE
Glucose, UA: NEGATIVE mg/dL
Hgb urine dipstick: NEGATIVE
Ketones, ur: NEGATIVE mg/dL
Leukocytes,Ua: NEGATIVE
Nitrite: NEGATIVE
Protein, ur: NEGATIVE mg/dL
Specific Gravity, Urine: 1.038 — ABNORMAL HIGH (ref 1.005–1.030)
pH: 7 (ref 5.0–8.0)

## 2022-02-02 LAB — CBC WITH DIFFERENTIAL/PLATELET
Abs Immature Granulocytes: 0.03 K/uL (ref 0.00–0.07)
Basophils Absolute: 0 K/uL (ref 0.0–0.1)
Basophils Relative: 0 %
Eosinophils Absolute: 0.1 K/uL (ref 0.0–0.5)
Eosinophils Relative: 1 %
HCT: 33.6 % — ABNORMAL LOW (ref 36.0–46.0)
Hemoglobin: 10.3 g/dL — ABNORMAL LOW (ref 12.0–15.0)
Immature Granulocytes: 0 %
Lymphocytes Relative: 7 %
Lymphs Abs: 0.5 K/uL — ABNORMAL LOW (ref 0.7–4.0)
MCH: 24.4 pg — ABNORMAL LOW (ref 26.0–34.0)
MCHC: 30.7 g/dL (ref 30.0–36.0)
MCV: 79.6 fL — ABNORMAL LOW (ref 80.0–100.0)
Monocytes Absolute: 0.4 K/uL (ref 0.1–1.0)
Monocytes Relative: 5 %
Neutro Abs: 7.1 K/uL (ref 1.7–7.7)
Neutrophils Relative %: 87 %
Platelets: 336 K/uL (ref 150–400)
RBC: 4.22 MIL/uL (ref 3.87–5.11)
RDW: 14.4 % (ref 11.5–15.5)
WBC: 8.1 K/uL (ref 4.0–10.5)
nRBC: 0 % (ref 0.0–0.2)

## 2022-02-02 LAB — COMPREHENSIVE METABOLIC PANEL
ALT: 15 U/L (ref 0–44)
AST: 18 U/L (ref 15–41)
Albumin: 3.1 g/dL — ABNORMAL LOW (ref 3.5–5.0)
Alkaline Phosphatase: 53 U/L (ref 38–126)
Anion gap: 10 (ref 5–15)
BUN: 13 mg/dL (ref 6–20)
CO2: 22 mmol/L (ref 22–32)
Calcium: 8.5 mg/dL — ABNORMAL LOW (ref 8.9–10.3)
Chloride: 104 mmol/L (ref 98–111)
Creatinine, Ser: 1.02 mg/dL — ABNORMAL HIGH (ref 0.44–1.00)
GFR, Estimated: >60 mL/min (ref >60)
Glucose, Bld: 109 mg/dL — ABNORMAL HIGH (ref 70–99)
Potassium: 4.2 mmol/L (ref 3.5–5.1)
Sodium: 136 mmol/L (ref 135–145)
Total Bilirubin: 0.4 mg/dL (ref 0.3–1.2)
Total Protein: 6.7 g/dL (ref 6.5–8.1)

## 2022-02-02 LAB — RESP PANEL BY RT-PCR (FLU A&B, COVID) ARPGX2
Influenza A by PCR: NEGATIVE
Influenza B by PCR: NEGATIVE
SARS Coronavirus 2 by RT PCR: NEGATIVE

## 2022-02-02 LAB — LIPASE, BLOOD: Lipase: 30 U/L (ref 11–51)

## 2022-02-02 LAB — PREGNANCY, URINE: Preg Test, Ur: NEGATIVE

## 2022-02-02 MED ORDER — DICYCLOMINE HCL 10 MG PO CAPS
10.0000 mg | ORAL_CAPSULE | Freq: Once | ORAL | Status: AC
  Administered 2022-02-02: 10 mg via ORAL
  Filled 2022-02-02: qty 1

## 2022-02-02 MED ORDER — IOHEXOL 300 MG/ML  SOLN
100.0000 mL | Freq: Once | INTRAMUSCULAR | Status: AC | PRN
  Administered 2022-02-02: 100 mL via INTRAVENOUS

## 2022-02-02 MED ORDER — DICYCLOMINE HCL 10 MG/5ML PO SOLN
10.0000 mg | Freq: Once | ORAL | Status: DC
  Filled 2022-02-02: qty 5

## 2022-02-02 MED ORDER — LIDOCAINE VISCOUS HCL 2 % MT SOLN
15.0000 mL | Freq: Once | OROMUCOSAL | Status: AC
  Administered 2022-02-02: 15 mL via ORAL
  Filled 2022-02-02: qty 15

## 2022-02-02 MED ORDER — ALUM & MAG HYDROXIDE-SIMETH 200-200-20 MG/5ML PO SUSP
30.0000 mL | Freq: Once | ORAL | Status: AC
  Administered 2022-02-02: 30 mL via ORAL
  Filled 2022-02-02: qty 30

## 2022-02-02 MED ORDER — ONDANSETRON HCL 4 MG/2ML IJ SOLN
4.0000 mg | Freq: Once | INTRAMUSCULAR | Status: AC
  Administered 2022-02-02: 4 mg via INTRAVENOUS
  Filled 2022-02-02: qty 2

## 2022-02-02 MED ORDER — ONDANSETRON HCL 4 MG PO TABS: 4.0000 mg | ORAL_TABLET | Freq: Three times a day (TID) | ORAL | 0 refills | Status: AC | PRN

## 2022-02-02 MED ORDER — SODIUM CHLORIDE 0.9 % IV BOLUS
1000.0000 mL | Freq: Once | INTRAVENOUS | Status: AC
  Administered 2022-02-02: 1000 mL via INTRAVENOUS

## 2022-02-02 MED ORDER — FENTANYL CITRATE PF 50 MCG/ML IJ SOSY
50.0000 ug | PREFILLED_SYRINGE | Freq: Once | INTRAMUSCULAR | Status: AC
  Administered 2022-02-02: 50 ug via INTRAVENOUS
  Filled 2022-02-02: qty 1

## 2022-02-02 NOTE — ED Notes (Signed)
Pt verbalized understanding of d/c instructions, meds and followup care. Denies questions. VSS, no distress noted. Steady gait to exit with all belongings.  ?

## 2022-02-02 NOTE — ED Provider Notes (Cosign Needed)
[facility] EMERGENCY DEPARTMENT Provider Note   CSN: [id number] Arrival date & time: 02/02/22  1638     History PMH of cholecystectomy (early 2000s), gallstone pancreatitis Chief Complaint  Patient presents with   Emesis    Jennifer Spencer is a 51 y.o. female. Patient presents emergency department with a chief complaint of abdominal pain.  She says that the pain woke her up from sleep around 2 AM.  Pain is localized in her mid epigastric and umbilical region of her belly.  She describes her pain as "being punched in her stomach and stretched straight down."  She says that it sometimes radiates to her back.  She says the pain comes in waves that are more severe.  She has had associated nausea and vomiting.  She describes vomit as nonbloody and nonbilious.  She does states she had a bowel movement today x1.  She describes it as "plopping down".  I asked her what this meant, and she says it was not watery or hard.  She describes her stool as being soft.  She has been having regular bowel movements outside of this.  She says she has felt like she has had chills all morning.  No known fevers though.  She denies any pelvic pain, vaginal discharge, urinary symptoms, or flank pain.  She says she has a history of gallstone pancreatitis, however this does not feel the same as when she had pancreatitis.  Denies drinking alcohol.  She has not had any postcholecystectomy problems.  She denies any chest pain, shortness of breath, headache, or confusion.   Emesis Associated symptoms: abdominal pain   Associated symptoms: no diarrhea       Home Medications Prior to Admission medications   Medication Sig Start Date End Date Taking? Authorizing Provider  ondansetron (ZOFRAN) 4 MG tablet Take 1 tablet (4 mg total) by mouth every 8 (eight) hours as needed for up to 10 days for nausea or vomiting. 02/02/22 02/12/22 Yes Arvil Utz, Adora Fridge, PA-C  atorvastatin (LIPITOR) 20 MG tablet Take  1 tablet (20 mg total) by mouth daily. 11/20/20   Argentina Donovan, PA-C      Allergies    Allegra [fexofenadine], Aspirin, Bactrim [sulfamethoxazole-trimethoprim], Codeine, Depakote [divalproex sodium], Mushroom extract complex, Penicillins, Percocet [oxycodone-acetaminophen], Tequin [gatifloxacin], and Vicodin [hydrocodone-acetaminophen]    Review of Systems   Review of Systems  Gastrointestinal:  Positive for abdominal pain, nausea and vomiting. Negative for abdominal distention, blood in stool, constipation and diarrhea.  All other systems reviewed and are negative.  Physical Exam Updated Vital Signs BP 138/80    Pulse 97    Temp 100 F (37.8 C) (Oral)    Resp (!) 26    Ht '5\' 7"'$  (1.702 m)    SpO2 97%    BMI 43.54 kg/m  Physical Exam Vitals and nursing note reviewed.  Constitutional:      General: She is not in acute distress.    Appearance: Normal appearance. She is not ill-appearing, toxic-appearing or diaphoretic.  HENT:     Head: Normocephalic and atraumatic.     Nose: No nasal deformity.     Mouth/Throat:     Lips: Pink. No lesions.     Mouth: Mucous membranes are moist. No injury, lacerations, oral lesions or angioedema.     Pharynx: Oropharynx is clear. Uvula midline. No pharyngeal swelling, oropharyngeal exudate, posterior oropharyngeal erythema or uvula swelling.  Eyes:     General: Gaze aligned appropriately. No scleral icterus.  Right eye: No discharge.        Left eye: No discharge.     Conjunctiva/sclera: Conjunctivae normal.     Right eye: Right conjunctiva is not injected. No exudate or hemorrhage.    Left eye: Left conjunctiva is not injected. No exudate or hemorrhage.    Pupils: Pupils are equal, round, and reactive to light.  Cardiovascular:     Rate and Rhythm: Normal rate and regular rhythm.     Pulses: Normal pulses.          Radial pulses are 2+ on the right side and 2+ on the left side.       Dorsalis pedis pulses are 2+ on the right side and 2+  on the left side.     Heart sounds: Normal heart sounds, S1 normal and S2 normal. Heart sounds not distant. No murmur heard.   No friction rub. No gallop. No S3 or S4 sounds.  Pulmonary:     Effort: Pulmonary effort is normal. No accessory muscle usage or respiratory distress.     Breath sounds: Normal breath sounds. No stridor. No wheezing, rhonchi or rales.  Chest:     Chest wall: No tenderness.  Abdominal:     General: Abdomen is flat. Bowel sounds are normal. There is no distension.     Palpations: Abdomen is soft. There is no mass or pulsatile mass.     Tenderness: There is abdominal tenderness in the epigastric area and periumbilical area. There is guarding. There is no right CVA tenderness, left CVA tenderness or rebound. Negative signs include Murphy's sign.     Comments: There is no focal tenderness of the abdomen to the suprapubic, right or left lower quadrants.  No specific right upper quadrant patient.  Negative Murphy sign.  No reproducible CVA tenderness.  Musculoskeletal:     Right lower leg: No edema.     Left lower leg: No edema.  Skin:    General: Skin is warm and dry.     Coloration: Skin is not jaundiced or pale.     Findings: No bruising, erythema, lesion or rash.  Neurological:     General: No focal deficit present.     Mental Status: She is alert and oriented to person, place, and time.     GCS: GCS eye subscore is 4. GCS verbal subscore is 5. GCS motor subscore is 6.  Psychiatric:        Mood and Affect: Mood normal.        Behavior: Behavior normal. Behavior is cooperative.    ED Results / Procedures / Treatments   Labs (all labs ordered are listed, but only abnormal results are displayed) Labs Reviewed  CBC WITH DIFFERENTIAL/PLATELET - Abnormal; Notable for the following components:      Result Value   Hemoglobin 10.3 (*)    HCT 33.6 (*)    MCV 79.6 (*)    MCH 24.4 (*)    Lymphs Abs 0.5 (*)    All other components within normal limits  COMPREHENSIVE  METABOLIC PANEL - Abnormal; Notable for the following components:   Glucose, Bld 109 (*)    Creatinine, Ser 1.02 (*)    Calcium 8.5 (*)    Albumin 3.1 (*)    All other components within normal limits  URINALYSIS, ROUTINE W REFLEX MICROSCOPIC - Abnormal; Notable for the following components:   Specific Gravity, Urine 1.038 (*)    All other components within normal limits  RESP PANEL BY RT-PCR (  FLU A&B, COVID) ARPGX2  LIPASE, BLOOD  PREGNANCY, URINE    EKG None  Radiology CT Abdomen Pelvis W Contrast  Result Date: 02/02/2022 CLINICAL DATA:  Abdominal pain with nausea and vomiting. EXAM: CT ABDOMEN AND PELVIS WITH CONTRAST TECHNIQUE: Multidetector CT imaging of the abdomen and pelvis was performed using the standard protocol following bolus administration of intravenous contrast. RADIATION DOSE REDUCTION: This exam was performed according to the departmental dose-optimization program which includes automated exposure control, adjustment of the mA and/or kV according to patient size and/or use of iterative reconstruction technique. CONTRAST:  173m OMNIPAQUE IOHEXOL 300 MG/ML  SOLN COMPARISON:  None. FINDINGS: Lower chest: No acute abnormality. Hepatobiliary: No focal liver abnormality is seen. Status post cholecystectomy. No biliary dilatation. Pancreas: Unremarkable. No pancreatic ductal dilatation or surrounding inflammatory changes. Spleen: Normal in size without focal abnormality. Adrenals/Urinary Tract: Normal adrenal glands. The right kidney is within normal limits. 7 mm exophytic lesion arising off the posterior cortex of the interpolar left kidney is too small to characterize, image 37/3. No nephrolithiasis or hydronephrosis. Bladder is unremarkable. Stomach/Bowel: The stomach appears normal. There are few prominent loops of small bowel within the left hemiabdomen which measure up to 2.3 cm, image 31/6. There is no pathologic dilatation of the large or small bowel loops. No bowel wall  thickening or inflammation. The appendix is visualized and appears normal. Vascular/Lymphatic: No significant vascular findings are present. No enlarged abdominal or pelvic lymph nodes. Reproductive: Enlarged fibroid uterus is identified. The dominant fibroid is in the lower uterine segment with scattered calcifications measuring up to 7 cm. Subserosal fibroid arising off the anterior myometrium measures 2.2 cm. Ovaries are unremarkable. No adnexal mass. Other: No free fluid or fluid collections. Musculoskeletal: No acute or significant osseous findings. IMPRESSION: 1. There are few prominent loops of proximal small bowel which may reflect underlying enteritis. No additional acute abnormalities identified within the abdomen or pelvis. 2. Enlarged fibroid uterus. Electronically Signed   By: TKerby MoorsM.D.   On: 02/02/2022 08:48    Procedures Procedures  This patient was on telemetry or cardiac monitoring during their time in the ED.    Medications Ordered in ED Medications  alum & mag hydroxide-simeth (MAALOX/MYLANTA) 200-200-20 MG/5ML suspension 30 mL (30 mLs Oral Given 02/02/22 0718)    And  lidocaine (XYLOCAINE) 2 % viscous mouth solution 15 mL (15 mLs Oral Given 02/02/22 0718)  fentaNYL (SUBLIMAZE) injection 50 mcg (50 mcg Intravenous Given 02/02/22 0718)  sodium chloride 0.9 % bolus 1,000 mL (0 mLs Intravenous Stopped 02/02/22 0818)  ondansetron (ZOFRAN) injection 4 mg (4 mg Intravenous Given 02/02/22 0718)  dicyclomine (BENTYL) capsule 10 mg (10 mg Oral Given 02/02/22 0718)  iohexol (OMNIPAQUE) 300 MG/ML solution 100 mL (100 mLs Intravenous Contrast Given 02/02/22 05681    ED Course/ Medical Decision Making/ A&P                            Medical Decision Making Amount and/or Complexity of Data Reviewed Labs: ordered. Radiology: ordered.  Risk OTC drugs. Prescription drug management.    MDM  This is a 51y.o. female who presents to the ED with acute onset of colicky mid  abdominal pain.   My Impression, Plan, and ED Course:  On chart review, patient is still having menstrual cycles.  Will obtain pregnancy test to verify.  She does have a lot of ED visits, however none for this particular complaint. Vitals are  stable.  She does have some mild tachycardia to 104.  She is afebrile. Exam with reproducible mid epigastric and umbilical abdominal pain with some guarding present.  No focal tenderness present all. I doubt that this is an aortic dissection presentation with no chest pain or neurological features. Will obtain EKG to screen for ACS. Doubt gallbladder etiology given hx of cholecystectomy, but could be a biliary duct problem still. Felt to be less likely to be an obstruction. Pain is atypical for renal colic, but with colicky presentation, will consider. Also considering pancreatitis, PUD, perforation, AAA, DKA, viral infection, gastritis.  Plan to treat sx with GI cocktail and Fentanyl. Will obtain labs and CT imaging.   I personally ordered, reviewed, and interpreted all laboratory work and imaging and agree with radiologist interpretation. Results interpreted below: CBC with stable microcytic anemia.  No leukocytosis.  CMP is stable from baseline with no kidney dysfunction noted.  Lipase is negative.  Pregnancy test is negative.  Viral respiratory panel negative.  Urine with increased spec graph may be indicating some element of dehydration.  CT reveals few prominent loops of proximal small bowel which may reflect underlying enteritis.  Also an incidental finding of enlarged fibroid uterus present.  EKG is with mild sinus tach to 101.  Symptoms are likely due to a viral process.  On repeat exam, abdomen is nontender.  Symptoms are much improved after interventions.  At this point, I do not think that patient will require admission.  I discussed discharge options with patient.  Recommend fluids at home.  Will prescribe an antiemetic.  She can use Imodium if  development of diarrhea.  Return precautions are provided.   Charting Requirements Additional history is obtained from:  Independent historian External Records from outside source obtained and reviewed including: Multiple recent ED visits Social Determinants of Health:  Access to medical care Pertinant PMH that complicates patient's illness: cholecystectomy (early 2000s), gallstone pancreatitis  Patient Care Problems that were addressed during this visit: - Abdominal Pain: Acute illness with systemic symptoms - Uterine Fibroid: Chronic illness This patient was maintained on a cardiac monitor/telemetry. I personally viewed and interpreted the cardiac monitor which reveals an underlying rhythm of ST/NSR Medications given in ED: GI cocktail, Fentanyl, IVF, Zofran Reevaluation of the patient after these medicines showed that the patient improved Disposition: Supportive tx. Discharge. Return precautions   Portions of this note were generated with Dragon dictation software. Dictation errors may occur despite best attempts at proofreading.  Final Clinical Impression(s) / ED Diagnoses Final diagnoses:  Generalized abdominal pain    Rx / DC Orders ED Discharge Orders          Ordered    ondansetron (ZOFRAN) 4 MG tablet  Every 8 hours PRN        02/02/22 1022              Cherl Gorney, Adora Fridge, PA-C 02/02/22 1034

## 2022-02-02 NOTE — ED Triage Notes (Signed)
Pt states that she was awoken from sleep with lower abdominal pain and N/V. Pt reports multiple episodes of emesis PTA.  ?

## 2022-02-02 NOTE — Discharge Instructions (Addendum)
Your CT returned concerning for possible viral infection that is affecting your bowels.  To treat this, I recommend plenty of fluids.  You can take Zofran which I prescribed you for your nausea.  You can also take over-the-counter Imodium if you start to develop diarrhea. ?If you start to develop worsening symptoms, please return to the ED. ? ?Your CT was also notable for a uterine fibroid that is likely not causing your symptoms, but if you are experiencing abnormal vaginal bleeding, this may be contributing to those symptoms. You may have this followed up by an OBGYN.  ?

## 2022-03-29 ENCOUNTER — Emergency Department (HOSPITAL_COMMUNITY)
Admission: EM | Admit: 2022-03-29 | Discharge: 2022-03-29 | Disposition: A | Discharge status: Home / Self Care | Payer: Self-pay | Attending: Emergency Medicine | Admitting: Emergency Medicine

## 2022-03-29 ENCOUNTER — Emergency Department (HOSPITAL_COMMUNITY): Payer: Self-pay

## 2022-03-29 DIAGNOSIS — R7989 Other specified abnormal findings of blood chemistry: Secondary | ICD-10-CM | POA: Insufficient documentation

## 2022-03-29 DIAGNOSIS — R1084 Generalized abdominal pain: Secondary | ICD-10-CM | POA: Insufficient documentation

## 2022-03-29 DIAGNOSIS — N760 Acute vaginitis: Secondary | ICD-10-CM | POA: Insufficient documentation

## 2022-03-29 DIAGNOSIS — N939 Abnormal uterine and vaginal bleeding, unspecified: Secondary | ICD-10-CM

## 2022-03-29 DIAGNOSIS — B9689 Other specified bacterial agents as the cause of diseases classified elsewhere: Secondary | ICD-10-CM | POA: Insufficient documentation

## 2022-03-29 LAB — COMPREHENSIVE METABOLIC PANEL
ALT: 14 U/L (ref 0–44)
AST: 16 U/L (ref 15–41)
Albumin: 3.6 g/dL (ref 3.5–5.0)
Alkaline Phosphatase: 51 U/L (ref 38–126)
Anion gap: 5 (ref 5–15)
BUN: 27 mg/dL — ABNORMAL HIGH (ref 6–20)
CO2: 24 mmol/L (ref 22–32)
Calcium: 8.7 mg/dL — ABNORMAL LOW (ref 8.9–10.3)
Chloride: 108 mmol/L (ref 98–111)
Creatinine, Ser: 1 mg/dL (ref 0.44–1.00)
GFR, Estimated: >60 mL/min (ref >60)
Glucose, Bld: 90 mg/dL (ref 70–99)
Potassium: 4.4 mmol/L (ref 3.5–5.1)
Sodium: 137 mmol/L (ref 135–145)
Total Bilirubin: 0.5 mg/dL (ref 0.3–1.2)
Total Protein: 7.3 g/dL (ref 6.5–8.1)

## 2022-03-29 LAB — URINALYSIS, ROUTINE W REFLEX MICROSCOPIC
Bacteria, UA: NONE SEEN
Bilirubin Urine: NEGATIVE
Glucose, UA: NEGATIVE mg/dL
Ketones, ur: NEGATIVE mg/dL
Leukocytes,Ua: NEGATIVE
Nitrite: NEGATIVE
Protein, ur: NEGATIVE mg/dL
RBC / HPF: >50 RBC/hpf — ABNORMAL HIGH (ref 0–5)
Specific Gravity, Urine: 1.011 (ref 1.005–1.030)
pH: 5 (ref 5.0–8.0)

## 2022-03-29 LAB — CBC WITH DIFFERENTIAL/PLATELET
Abs Immature Granulocytes: 0.03 10*3/uL (ref 0.00–0.07)
Basophils Absolute: 0 10*3/uL (ref 0.0–0.1)
Basophils Relative: 1 %
Eosinophils Absolute: 0.3 10*3/uL (ref 0.0–0.5)
Eosinophils Relative: 5 %
HCT: 32.1 % — ABNORMAL LOW (ref 36.0–46.0)
Hemoglobin: 10 g/dL — ABNORMAL LOW (ref 12.0–15.0)
Immature Granulocytes: 1 %
Lymphocytes Relative: 32 %
Lymphs Abs: 1.8 10*3/uL (ref 0.7–4.0)
MCH: 24.4 pg — ABNORMAL LOW (ref 26.0–34.0)
MCHC: 31.2 g/dL (ref 30.0–36.0)
MCV: 78.5 fL — ABNORMAL LOW (ref 80.0–100.0)
Monocytes Absolute: 0.6 10*3/uL (ref 0.1–1.0)
Monocytes Relative: 11 %
Neutro Abs: 2.8 10*3/uL (ref 1.7–7.7)
Neutrophils Relative %: 50 %
Platelets: 292 10*3/uL (ref 150–400)
RBC: 4.09 MIL/uL (ref 3.87–5.11)
RDW: 15.8 % — ABNORMAL HIGH (ref 11.5–15.5)
WBC: 5.6 10*3/uL (ref 4.0–10.5)
nRBC: 0 % (ref 0.0–0.2)

## 2022-03-29 LAB — WET PREP, GENITAL
Sperm: NONE SEEN
Trich, Wet Prep: NONE SEEN
WBC, Wet Prep HPF POC: <10 (ref <10)
Yeast Wet Prep HPF POC: NONE SEEN

## 2022-03-29 LAB — HCG, QUANTITATIVE, PREGNANCY: hCG, Beta Chain, Quant, S: <1 m[IU]/mL (ref <5)

## 2022-03-29 LAB — LIPASE, BLOOD: Lipase: 38 U/L (ref 11–51)

## 2022-03-29 MED ORDER — ONDANSETRON 8 MG PO TBDP
8.0000 mg | ORAL_TABLET | Freq: Once | ORAL | Status: AC
  Administered 2022-03-29: 8 mg via ORAL
  Filled 2022-03-29: qty 1

## 2022-03-29 MED ORDER — ONDANSETRON HCL 4 MG PO TABS: 4.0000 mg | ORAL_TABLET | Freq: Four times a day (QID) | ORAL | 0 refills | Status: DC

## 2022-03-29 MED ORDER — METRONIDAZOLE 500 MG PO TABS: 500.0000 mg | ORAL_TABLET | Freq: Two times a day (BID) | ORAL | 0 refills | Status: DC

## 2022-03-29 MED ORDER — IOHEXOL 300 MG/ML  SOLN
100.0000 mL | Freq: Once | INTRAMUSCULAR | Status: AC | PRN
  Administered 2022-03-29: 100 mL via INTRAVENOUS

## 2022-03-29 MED ORDER — SODIUM CHLORIDE (PF) 0.9 % IJ SOLN
INTRAMUSCULAR | Status: AC
  Filled 2022-03-29: qty 50

## 2022-03-29 NOTE — ED Provider Triage Note (Addendum)
Emergency Medicine Provider Triage Evaluation Note ? ?Jennifer Spencer , a 51 y.o. female  was evaluated in triage.  Pt complains of upper abdominal pain, nausea, vomiting, late cycle.  Patient reports that her cycle was 2 days late, started yesterday.  Patient continues that beginning yesterday she had nausea and vomiting, shortness of breath, upper abdominal pain.  Patient denies any known fevers, lightheadedness, dizziness weakness.  Patient reports that she is going through 1 tampon an hour.  Patient also states that she has history of cholecystectomy due to pancreatitis.  Patient states this occurred in 1998 and she has had no issues with pancreas since then. ? ?Review of Systems  ?Positive:  ?Negative:  ? ?Physical Exam  ?BP (!) 149/81 (BP Location: Right Arm)   Pulse 93   Temp 98.2 ?F (36.8 ?C) (Oral)   Resp 16   Ht '5\' 7"'$  (1.702 m)   Wt 113.4 kg   SpO2 100%   BMI 39.16 kg/m?  ?Gen:   Awake, no distress   ?Resp:  Normal effort  ?MSK:   Moves extremities without difficulty  ?Other:  Diffuse upper abdominal tenderness with guarding.  Lungs clear bilaterally. ? ?Medical Decision Making  ?Medically screening exam initiated at 12:23 PM.  Appropriate orders placed.  [name] was informed that the remainder of the evaluation will be completed by another provider, this initial triage assessment does not replace that evaluation, and the importance of remaining in the ED until their evaluation is complete. ? ? ?  ? ? ?  ?[name], PA-C ?03/29/22 1225 ? ?

## 2022-03-29 NOTE — ED Notes (Signed)
Pt given blanket.

## 2022-03-29 NOTE — ED Notes (Addendum)
Pt ambulated to the restroom O2 level dropped to 83% room air ?

## 2022-03-29 NOTE — Discharge Instructions (Addendum)
You were seen here in the ER for evaluation of you heavy vaginal bleeding. Your labs were unremarkable. Your imaging shows the large fibroid seen in your uterus again. This could be the cause of your heavy bleeding. I have placed an ambulatory referral to the [facility] for follow up. If you have not heard back in a week, please call to schedule an appointment. Additionally, I have included the information to a primary care provider. Please call to schedule an appointment to establish care. I am sending you home with two medications. One is Zofran, an anti-nausea medication, for you to take as needed. Additionally, I am sending you home on Flagyl which is an antibiotic used for bacterial vaginosis. I have included additional information in this discharge information, please read. If you have any chest pain, shortness of breath, worsening bleeding, lightheadedness, weakness, worsening abdominal pain, fever, please return to the ER for evaluation.  ? ?Contact a doctor if: ?The bleeding lasts more than one week. ?You feel dizzy at times. ?You feel like you may vomit (nausea). ?You vomit. ?You feel light-headed or weak. ?Your symptoms get worse. ?Get help right away if: ?You faint. ?You have to change pads every hour. ?You have pain in your belly. ?You have a fever or chills. ?You get sweaty or weak. ?You pass large blood clots from your vagina. ?These symptoms may be an emergency. Get help right away. Call your local emergency services (911 in the [place] ?Do not wait to see if the symptoms will go away. ?Do not drive yourself to the hospital. ?

## 2022-03-29 NOTE — ED Triage Notes (Signed)
Pt states she started her menstrual cycle 2 days ago. Pt states the bleeding is heavier now. Pt also reports abdominal cramping and nausea. States she gets short of breath when walking uphill. ?

## 2022-03-29 NOTE — ED Provider Notes (Signed)
?[facility] DEPT ?Provider Note ? ? ?CSN: [id number] ?Arrival date & time: 03/29/22  1116 ? ?  ? ?History ?Chief Complaint  ?Patient presents with  ? Vaginal Bleeding  ? ? ?Jennifer Spencer is a 51 y.o. female with distant h/o seizures presents to the ED for evaluation of vaginal bleeding that is heavier than usual. She reports that her menstrual cycles are usually regular, but this one was two weeks late. She reports it is heavier than normal. She reports she is passing some dime sized clots, but most are smaller. She is going through one super plus tampon every 1 or so. She reports she is having epigastric pain since yesterday. Reports nausea. Reports one episode of vomiting per day for the past two days. Decrease in appetite. She has urinated at least 4 times today. She denies lightheadedness. She is feeling some SOB with exertion or with going up hill. No SOB at rest. Denies any chest pain. She reports this is new and has been happening for the past few days. Denies any headache or blurry vision. Denies any dysuria, vaginal discharge, urgency/frequency.  ? ?Last PAP was in 2014. Has not followed up with OBGYN since then. Has not follow up about the leiomyomas and uterine fibroids seen.  ? ?Vaginal Bleeding ?Associated symptoms: abdominal pain and nausea   ?Associated symptoms: no dysuria and no fever   ? ?  ? ?Home Medications ?Prior to Admission medications   ?Medication Sig Start Date End Date Taking? Authorizing Provider  ?atorvastatin (LIPITOR) 20 MG tablet Take 1 tablet (20 mg total) by mouth daily. 11/20/20   Argentina Donovan, PA-C  ?   ? ?Allergies    ?Allegra [fexofenadine], Aspirin, Bactrim [sulfamethoxazole-trimethoprim], Codeine, Depakote [divalproex sodium], Mushroom extract complex, Penicillins, Percocet [oxycodone-acetaminophen], Tequin [gatifloxacin], and Vicodin [hydrocodone-acetaminophen]   ? ?Review of Systems   ?Review of Systems  ?Constitutional:  Negative  for chills and fever.  ?Eyes:  Negative for visual disturbance.  ?Respiratory:  Positive for shortness of breath. Negative for cough.   ?Cardiovascular:  Negative for chest pain, palpitations and leg swelling.  ?Gastrointestinal:  Positive for abdominal pain, nausea and vomiting. Negative for anal bleeding, blood in stool, constipation and diarrhea.  ?Genitourinary:  Positive for vaginal bleeding. Negative for dysuria, frequency and urgency.  ?Neurological:  Positive for light-headedness. Negative for syncope, weakness and headaches.  ? ?Physical Exam ?Updated Vital Signs ?BP 140/76   Pulse 61   Temp 98.2 ?F (36.8 ?C) (Oral)   Resp 18   Ht '5\' 7"'$  (1.702 m)   Wt 113.4 kg   SpO2 98%   BMI 39.16 kg/m?  ?Physical Exam ?Vitals and nursing note reviewed. Exam conducted with a chaperone present ([place], Tech).  ?Constitutional:   ?   General: She is not in acute distress. ?   Appearance: Normal appearance. She is not ill-appearing or toxic-appearing.  ?HENT:  ?   Head: Normocephalic and atraumatic.  ?Eyes:  ?   General: No scleral icterus. ?Cardiovascular:  ?   Rate and Rhythm: Normal rate and regular rhythm.  ?Pulmonary:  ?   Effort: Pulmonary effort is normal. No respiratory distress.  ?   Breath sounds: Normal breath sounds.  ?Abdominal:  ?   General: Bowel sounds are normal.  ?   Palpations: Abdomen is soft.  ?   Tenderness: There is abdominal tenderness. There is no right CVA tenderness, left CVA tenderness, guarding or rebound.  ?   Comments: Abdominal exam limited secondary  to body habitus.  Abdomen is diffusely tender to palpation without any rebound or guarding.  No CVA tenderness bilaterally.  Normal active bowel sounds.  No overlying skin changes noted.  ?Genitourinary: ?   Vagina: No signs of injury. No lesions.  ?   Cervix: Cervical bleeding present. No cervical motion tenderness or friability.  ?   Uterus: Normal.   ?   Comments: Very small amount of blood noted in the vaginal canal.  No clots  visualized.  Cervix appears normal.  No CMT.  No friability. ?Musculoskeletal:     ?   General: No deformity.  ?   Cervical back: Normal range of motion.  ?Skin: ?   General: Skin is warm and dry.  ?Neurological:  ?   General: No focal deficit present.  ?   Mental Status: She is alert. Mental status is at baseline.  ?   Gait: Gait normal.  ? ? ?ED Results / Procedures / Treatments   ?Labs ?(all labs ordered are listed, but only abnormal results are displayed) ?Labs Reviewed  ?CBC WITH DIFFERENTIAL/PLATELET - Abnormal; Notable for the following components:  ?    Result Value  ? Hemoglobin 10.0 (*)   ? HCT 32.1 (*)   ? MCV 78.5 (*)   ? MCH 24.4 (*)   ? RDW 15.8 (*)   ? All other components within normal limits  ?COMPREHENSIVE METABOLIC PANEL - Abnormal; Notable for the following components:  ? BUN 27 (*)   ? Calcium 8.7 (*)   ? All other components within normal limits  ?URINALYSIS, ROUTINE W REFLEX MICROSCOPIC - Abnormal; Notable for the following components:  ? Color, Urine STRAW (*)   ? Hgb urine dipstick MODERATE (*)   ? RBC / HPF >50 (*)   ? All other components within normal limits  ?LIPASE, BLOOD  ? ? ?EKG ?EKG Interpretation ? ?Date/Time:  Sunday Mar 29 2022 11:36:05 EDT ?Ventricular Rate:  71 ?PR Interval:  154 ?QRS Duration: 99 ?QT Interval:  368 ?QTC Calculation: 400 ?R Axis:   34 ?Text Interpretation: Sinus arrhythmia since last tracing no significant change Confirmed by Malvin Johns [phone]) on 03/29/2022 2:36:04 PM ? ?Radiology ?CT ABDOMEN PELVIS W CONTRAST ? ?Result Date: 03/29/2022 ?CLINICAL DATA:  Acute abdominal pain and cramping. Nausea. Heavy menses. EXAM: CT ABDOMEN AND PELVIS WITH CONTRAST TECHNIQUE: Multidetector CT imaging of the abdomen and pelvis was performed using the standard protocol following bolus administration of intravenous contrast. RADIATION DOSE REDUCTION: This exam was performed according to the departmental dose-optimization program which includes automated exposure control,  adjustment of the mA and/or kV according to patient size and/or use of iterative reconstruction technique. CONTRAST:  173m OMNIPAQUE IOHEXOL 300 MG/ML  SOLN COMPARISON:  02/02/2022 FINDINGS: Lower Chest: No acute findings. Hepatobiliary: No hepatic masses identified. Prior cholecystectomy. No evidence of biliary obstruction. Pancreas:  No mass or inflammatory changes. Spleen: Within normal limits in size and appearance. Adrenals/Urinary Tract: Sub-cm low-attenuation lesion in the posterior midpole the left kidney is too small to characterize but remains stable since previous study. No evidence of ureteral calculi or hydronephrosis. Stomach/Bowel: No evidence of obstruction, inflammatory process or abnormal fluid collections. Normal appendix visualized. Vascular/Lymphatic: No pathologically enlarged lymph nodes. No acute vascular findings. Reproductive: Large uterine fibroids are again seen, largest of which is partially calcified and measures approximately 7 cm in diameter. Adnexal regions are unremarkable. Other:  None. Musculoskeletal:  No suspicious bone lesions identified. IMPRESSION: No acute findings within the abdomen or  pelvis. Stable uterine fibroids, largest measuring approximately 7 cm. Electronically Signed   By: Marlaine Hind M.D.   On: 03/29/2022 17:28   ? ?Procedures ?Procedures  ? ?Medications Ordered in ED ?Medications  ?ondansetron (ZOFRAN-ODT) disintegrating tablet 8 mg (8 mg Oral Given 03/29/22 1229)  ? ? ?ED Course/ Medical Decision Making/ A&P ?  ?                        ?Medical Decision Making ?Amount and/or Complexity of Data Reviewed ?Labs: ordered. ?Radiology: ordered. ? ?Risk ?Prescription drug management. ? ? ?51 year old female presents the emergency department for evaluation of vaginal bleeding that is been heavy for the past 2 days.  Differential diagnosis includes is not limited to abnormal uterine bleeding, dysfunctional uterine bleeding, leiomyoma, menopausal bleeding, anemia.  Vital  signs are unremarkable.  Patient normotensive, afebrile, normal heart rate, satting well on room air without any increased work of breathing.  Physical exam is pertinent for some very small amount of blood noted in the

## 2022-08-13 ENCOUNTER — Other Ambulatory Visit (HOSPITAL_COMMUNITY): Payer: Self-pay

## 2023-02-05 ENCOUNTER — Other Ambulatory Visit (HOSPITAL_COMMUNITY): Payer: Self-pay

## 2023-06-24 ENCOUNTER — Other Ambulatory Visit: Payer: Self-pay

## 2023-06-24 ENCOUNTER — Emergency Department (HOSPITAL_COMMUNITY)
Admission: EM | Admit: 2023-06-24 | Discharge: 2023-06-25 | Disposition: A | Discharge status: Home / Self Care | Payer: BLUE CROSS/BLUE SHIELD | Attending: Emergency Medicine | Admitting: Emergency Medicine

## 2023-06-24 ENCOUNTER — Encounter (HOSPITAL_COMMUNITY): Payer: Self-pay | Admitting: Emergency Medicine

## 2023-06-24 ENCOUNTER — Emergency Department (HOSPITAL_COMMUNITY): Payer: BLUE CROSS/BLUE SHIELD

## 2023-06-24 DIAGNOSIS — M546 Pain in thoracic spine: Secondary | ICD-10-CM | POA: Diagnosis not present

## 2023-06-24 DIAGNOSIS — M542 Cervicalgia: Secondary | ICD-10-CM | POA: Diagnosis not present

## 2023-06-24 DIAGNOSIS — M79673 Pain in unspecified foot: Secondary | ICD-10-CM | POA: Diagnosis not present

## 2023-06-24 DIAGNOSIS — M25571 Pain in right ankle and joints of right foot: Secondary | ICD-10-CM | POA: Diagnosis not present

## 2023-06-24 DIAGNOSIS — M25512 Pain in left shoulder: Secondary | ICD-10-CM | POA: Diagnosis present

## 2023-06-24 DIAGNOSIS — M5412 Radiculopathy, cervical region: Secondary | ICD-10-CM

## 2023-06-24 NOTE — ED Triage Notes (Signed)
Pt arrives with left arm in sling and right foot in CAM boot. States she had a fall the middle of last month and was seen at [facility] and has not improved.

## 2023-06-25 ENCOUNTER — Emergency Department (HOSPITAL_COMMUNITY): Payer: BLUE CROSS/BLUE SHIELD

## 2023-06-25 ENCOUNTER — Other Ambulatory Visit: Payer: Self-pay

## 2023-06-25 DIAGNOSIS — M25512 Pain in left shoulder: Secondary | ICD-10-CM | POA: Diagnosis not present

## 2023-06-25 MED ORDER — METHYLPREDNISOLONE 4 MG PO TBPK
ORAL_TABLET | ORAL | 0 refills | Status: DC
  Filled 2023-06-25: qty 21, 6d supply, fill #0

## 2023-06-25 MED ORDER — KETOROLAC TROMETHAMINE 15 MG/ML IJ SOLN
15.0000 mg | Freq: Once | INTRAMUSCULAR | Status: AC
  Administered 2023-06-25: 15 mg via INTRAMUSCULAR
  Filled 2023-06-25: qty 1

## 2023-06-25 MED ORDER — METHOCARBAMOL 500 MG PO TABS
500.0000 mg | ORAL_TABLET | Freq: Three times a day (TID) | ORAL | 0 refills | Status: DC | PRN
  Filled 2023-06-25: qty 20, 7d supply, fill #0

## 2023-06-25 MED ORDER — METHOCARBAMOL 500 MG PO TABS
500.0000 mg | ORAL_TABLET | Freq: Once | ORAL | Status: AC
  Administered 2023-06-25: 500 mg via ORAL
  Filled 2023-06-25: qty 1

## 2023-06-25 NOTE — ED Provider Notes (Signed)
[place] EMERGENCY DEPARTMENT AT [facility] Provider Note   CSN: [id number] Arrival date & time: 06/24/23  1624     History  Chief Complaint  Patient presents with   Shoulder Pain   Foot Pain    Jennifer Spencer is a 52 y.o. female.  Patient with a history of years presenting with ongoing left shoulder, neck and right ankle pain.  States she fell July 17 after slipping on a wet floor.  She was seen at [facility].  She had x-rays of her shoulder, knee and ankle that were negative.  She has been using naproxen for pain.  She was placed in a sling and cam walker for symptom control.  States she is still having severe pain to her right ankle and foot.  Also having pain to her left shoulder, neck and upper back area.  Denies hitting head or losing consciousness.  No focal weakness, numbness or tingling.  No chest pain or shortness of breath.  No blood thinner use.  Most of her pain is to her right lateral foot and ankle.  Denies any hip or knee pain.  Also having pain to left paraspinal neck and upper back and shoulder area.  No focal weakness, numbness or tingling.  No blood thinner use.  The history is provided by the patient.  Shoulder Pain Associated symptoms: no fever   Foot Pain Pertinent negatives include no chest pain, no abdominal pain, no headaches and no shortness of breath.       Home Medications Prior to Admission medications   Medication Sig Start Date End Date Taking? Authorizing Provider  atorvastatin (LIPITOR) 20 MG tablet Take 1 tablet (20 mg total) by mouth daily. 11/20/20   Anders Simmonds, PA-C  metroNIDAZOLE (FLAGYL) 500 MG tablet Take 1 tablet (500 mg total) by mouth 2 (two) times daily. 03/29/22   Achille Rich, PA-C  ondansetron (ZOFRAN) 4 MG tablet Take 1 tablet (4 mg total) by mouth every 6 (six) hours. 03/29/22   Achille Rich, PA-C      Allergies    Mushroom extract complex, Allegra [fexofenadine], Aspirin, Bactrim  [sulfamethoxazole-trimethoprim], Codeine, Depakote [divalproex sodium], Penicillins, Tequin [gatifloxacin], Vicodin [hydrocodone-acetaminophen], and Percocet [oxycodone-acetaminophen]    Review of Systems   Review of Systems  Constitutional:  Negative for activity change, appetite change and fever.  HENT:  Negative for congestion and rhinorrhea.   Respiratory:  Negative for cough, chest tightness and shortness of breath.   Cardiovascular:  Negative for chest pain.  Gastrointestinal:  Negative for abdominal pain, nausea and vomiting.  Genitourinary:  Negative for dysuria and hematuria.  Musculoskeletal:  Positive for arthralgias and myalgias.  Skin:  Negative for rash.  Neurological:  Negative for dizziness, facial asymmetry, weakness and headaches.   all other systems are negative except as noted in the HPI and PMH.    Physical Exam Updated Vital Signs BP (!) 140/88   Pulse 78   Temp 98.1 F (36.7 C) (Oral)   Resp 16   SpO2 98%  Physical Exam Vitals and nursing note reviewed.  Constitutional:      General: She is not in acute distress.    Appearance: She is well-developed.  HENT:     Head: Normocephalic and atraumatic.     Mouth/Throat:     Pharynx: No oropharyngeal exudate.  Eyes:     Conjunctiva/sclera: Conjunctivae normal.     Pupils: Pupils are equal, round, and reactive to light.  Neck:  Comments: Paraspinal cervical tenderness. No midline tenderness. Cardiovascular:     Rate and Rhythm: Normal rate and regular rhythm.     Heart sounds: Normal heart sounds. No murmur heard. Pulmonary:     Effort: Pulmonary effort is normal. No respiratory distress.     Breath sounds: Normal breath sounds.  Abdominal:     Palpations: Abdomen is soft.     Tenderness: There is no abdominal tenderness. There is no guarding or rebound.  Musculoskeletal:        General: Swelling and tenderness present. Normal range of motion.     Cervical back: Normal range of motion.     Comments:  Paraspinal cervical tenderness and spasm, tenderness and spasm to left trapezius.  Full range of motion of left shoulder but painful.  Intact radial pulse and cardinal hand movements.  Tenderness to right lateral ankle and base of fifth metatarsal.  No deformity.  Intact DP and PT pulse.  No focal weakness, numbness or tingling.  Skin:    General: Skin is warm.  Neurological:     Mental Status: She is alert and oriented to person, place, and time.     Cranial Nerves: No cranial nerve deficit.     Motor: No abnormal muscle tone.     Coordination: Coordination normal.     Comments:  5/5 strength throughout. CN 2-12 intact.Equal grip strength.   Psychiatric:        Behavior: Behavior normal.     ED Results / Procedures / Treatments   Labs (all labs ordered are listed, but only abnormal results are displayed) Labs Reviewed - No data to display  EKG None  Radiology CT Cervical Spine Wo Contrast  Result Date: 06/25/2023 CLINICAL DATA:  Neck trauma, impaired ROM (Age 18-64y) EXAM: CT CERVICAL SPINE WITHOUT CONTRAST TECHNIQUE: Multidetector CT imaging of the cervical spine was performed without intravenous contrast. Multiplanar CT image reconstructions were also generated. RADIATION DOSE REDUCTION: This exam was performed according to the departmental dose-optimization program which includes automated exposure control, adjustment of the mA and/or kV according to patient size and/or use of iterative reconstruction technique. COMPARISON:  None Available. FINDINGS: Alignment: Reversal of normal cervical lordosis centered at the C5 level likely due to degenerative changes and positioning. Skull base and vertebrae: Mild degenerative changes most prominent at the C5-C6 level. No acute fracture. No aggressive appearing focal osseous lesion or focal pathologic process. Soft tissues and spinal canal: No prevertebral fluid or swelling. No visible canal hematoma. Upper chest: Unremarkable. Other: None.  IMPRESSION: No acute displaced fracture or traumatic listhesis of the cervical spine. Electronically Signed   By: Tish Frederickson M.D.   On: 06/25/2023 02:48   DG Foot Complete Right  Result Date: 06/25/2023 CLINICAL DATA:  Fall with right foot and knee pain. EXAM: RIGHT FOOT COMPLETE - 3+ VIEW; RIGHT KNEE - COMPLETE 4+ VIEW COMPARISON:  None Available. FINDINGS: Right knee: There is no evidence of fracture or dislocation. Mild-to-moderate tricompartmental degenerative changes are noted, most pronounced in the patellofemoral compartment. No joint effusion. Soft tissues are unremarkable. Right foot: No acute fracture or dislocation. There is a large calcaneal spur. Minimal degenerative changes are noted in the midfoot. The soft tissues are unremarkable. IMPRESSION: 1. No acute fracture or dislocation at the right foot or right knee. 2. 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 3. Large calcaneal spur. Electronically Signed   By: Thornell Sartorius M.D.   On: 06/25/2023 01:47   DG Knee Complete 4 Views Right  Result Date: 06/25/2023 CLINICAL DATA:  Fall with right foot and knee pain. EXAM: RIGHT FOOT COMPLETE - 3+ VIEW; RIGHT KNEE - COMPLETE 4+ VIEW COMPARISON:  None Available. FINDINGS: Right knee: There is no evidence of fracture or dislocation. Mild-to-moderate tricompartmental degenerative changes are noted, most pronounced in the patellofemoral compartment. No joint effusion. Soft tissues are unremarkable. Right foot: No acute fracture or dislocation. There is a large calcaneal spur. Minimal degenerative changes are noted in the midfoot. The soft tissues are unremarkable. IMPRESSION: 1. No acute fracture or dislocation at the right foot or right knee. 2. 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 3. Large calcaneal spur. Electronically Signed   By: Thornell Sartorius M.D.   On: 06/25/2023 01:47   DG Ankle Complete Right  Result Date: 06/24/2023 CLINICAL DATA:  fall EXAM: RIGHT ANKLE - COMPLETE 3+  VIEW COMPARISON:  June 09, 2023 FINDINGS: No acute fracture or dislocation. Ankle mortise is preserved. Enthesophyte of the plantar calcaneus. No area of erosion or osseous destruction. No unexpected radiopaque foreign body. Soft tissues are unremarkable. IMPRESSION: 1. No acute fracture or dislocation. Electronically Signed   By: Meda Klinefelter M.D.   On: 06/24/2023 18:17   DG Shoulder Left  Result Date: 06/24/2023 CLINICAL DATA:  fall EXAM: LEFT SHOULDER - 2+ VIEW COMPARISON:  June 09, 2023 FINDINGS: No acute fracture or dislocation. Joint spaces and alignment are maintained. No area of erosion or osseous destruction. No unexpected radiopaque foreign body. Soft tissues are unremarkable. IMPRESSION: 1. No acute fracture or dislocation. Electronically Signed   By: Meda Klinefelter M.D.   On: 06/24/2023 18:15    Procedures Procedures    Medications Ordered in ED Medications  ketorolac (TORADOL) 15 MG/ML injection 15 mg (has no administration in time range)  methocarbamol (ROBAXIN) tablet 500 mg (has no administration in time range)    ED Course/ Medical Decision Making/ A&P                                 Medical Decision Making Amount and/or Complexity of Data Reviewed Labs: ordered. Decision-making details documented in ED Course. Radiology: ordered and independent interpretation performed. Decision-making details documented in ED Course. ECG/medicine tests: ordered and independent interpretation performed. Decision-making details documented in ED Course.  Risk Prescription drug management.   Fall 2 weeks ago with ongoing left shoulder, neck and ankle pain.  Negative imaging at outside hospital.  Vital stable.  No distress.  No new fall.  Patient with significant tenderness to right ankle and foot today.  This is negative on x-ray for acute traumatic injury.  Results reviewed interpreted by me. Does have large calcaneal spur.  She is not tender in this area however.  Continue  cam walker boot for comfort.  She does have significant paraspinal cervical muscle pain and spasm.  No midline tenderness or step-off.  Equal grip strength, sensation and pulses bilaterally.  CT scan shows no significant injury to C-spine.  Results reviewed interpreted by me.  Will give course of anti-inflammatories as well as muscle relaxers.  She declines pain medication. Followup with PCP.  Return precautions discussed        Final Clinical Impression(s) / ED Diagnoses Final diagnoses:  None    Rx / DC Orders ED Discharge Orders     None         Haidyn Kilburg, Jeannett Senior, MD 06/25/23 [phone]

## 2023-06-25 NOTE — Discharge Instructions (Signed)
Your x-rays are negative for fractures.  You do have a large heel spur.  He may use the cam walker boot for comfort as needed.  Take the muscle relaxers and steroids as prescribed for a possible pinched nerve in your neck.  Do not take Motrin while taking the Solu-Medrol as it increases your risk for bleeding and kidney problems.  Follow-up with your primary doctor.  Return to the ED with worsening pain, weakness, numbness, tingling or other concerns.

## 2023-06-25 NOTE — Progress Notes (Signed)
Orthopedic Tech Progress Note Patient Details:  Jennifer Spencer Sep 13, 1971 [id number]  Ortho Devices Type of Ortho Device: CAM walker Ortho Device/Splint Location: RLE Ortho Device/Splint Interventions: Adjustment, Application, Ordered   Post Interventions Patient Tolerated: Well  Al Decant 06/25/2023, 3:33 AM

## 2023-06-28 ENCOUNTER — Other Ambulatory Visit: Payer: Self-pay

## 2023-07-19 ENCOUNTER — Encounter: Payer: Self-pay | Admitting: Internal Medicine

## 2023-08-23 ENCOUNTER — Other Ambulatory Visit: Payer: Self-pay

## 2023-08-23 ENCOUNTER — Ambulatory Visit (AMBULATORY_SURGERY_CENTER): Payer: Medicaid Other

## 2023-08-23 ENCOUNTER — Telehealth: Payer: Self-pay | Admitting: Internal Medicine

## 2023-08-23 VITALS — Ht 67.0 in | Wt 276.0 lb

## 2023-08-23 DIAGNOSIS — Z1211 Encounter for screening for malignant neoplasm of colon: Secondary | ICD-10-CM

## 2023-08-23 MED ORDER — PEG 3350-KCL-NA BICARB-NACL 420 G PO SOLR: 4000.0000 mL | Freq: Once | ORAL | 0 refills | Status: AC

## 2023-08-23 MED ORDER — PEG 3350-KCL-NA BICARB-NACL 420 G PO SOLR: 4000.0000 mL | Freq: Once | ORAL | 0 refills | Status: DC

## 2023-08-23 NOTE — Progress Notes (Signed)
Pre visit completed via phone call; Patient verified name, DOB, and address; No egg or soy allergy known to patient  No issues known to pt with past sedation with any surgeries or procedures Patient denies ever being told they had issues or difficulty with intubation  No FH of Malignant Hyperthermia Pt is not on diet pills Pt is not on home 02;  Pt is not on blood thinners  Pt denies issues with constipation;  No A fib or A flutter Have any cardiac testing pending--NO Insurance verified during PV appt--- [facility] Medicaid primary Pt can ambulate without assistance;  Pt denies use of chewing tobacco Discussed diabetic/weight loss medication holds; Discussed NSAID holds; Checked BMI to be less than 50; Pt instructed to use Single[employer] or GoodRx for a price reduction on prep  Patient's chart reviewed by Cathlyn Parsons CNRA prior to previsit and patient appropriate for the [facility].  Pre visit completed and red dot placed by patient's name on their procedure day (on provider's schedule).    Instructions sent to MyChart per patient's request;;

## 2023-08-23 NOTE — Telephone Encounter (Signed)
Rx sent pat made aware

## 2023-08-23 NOTE — Telephone Encounter (Signed)
Inbound call from patient stating the pharmacy that prep medication was sent to is out of stock. Patient requesting to have prep medication to be sent to [facility] pharmacy on [address] in [place] instead. Please advise, thank you.

## 2023-09-06 ENCOUNTER — Encounter: Payer: Self-pay | Admitting: Internal Medicine

## 2023-09-06 ENCOUNTER — Ambulatory Visit (AMBULATORY_SURGERY_CENTER): Payer: BLUE CROSS/BLUE SHIELD | Admitting: Internal Medicine

## 2023-09-06 VITALS — BP 120/87 | HR 80 | Temp 97.3°F | Resp 20 | Ht 67.0 in | Wt 276.0 lb

## 2023-09-06 DIAGNOSIS — Z1211 Encounter for screening for malignant neoplasm of colon: Secondary | ICD-10-CM | POA: Diagnosis not present

## 2023-09-06 MED ORDER — SODIUM CHLORIDE 0.9 % IV SOLN: 500.0000 mL | INTRAVENOUS | Status: DC

## 2023-09-06 NOTE — Progress Notes (Signed)
Report to PACU, RN, vss, BBS= Clear.  

## 2023-09-06 NOTE — Progress Notes (Signed)
GASTROENTEROLOGY PROCEDURE H&P NOTE   Primary Care Physician: Patient, No Pcp Per    Reason for Procedure:   Colon cancer screening  Plan:    Colonoscopy  Patient is appropriate for endoscopic procedure(s) in the ambulatory ([facility]) setting.  The nature of the procedure, as well as the risks, benefits, and alternatives were carefully and thoroughly reviewed with the patient. Ample time for discussion and questions allowed. The patient understood, was satisfied, and agreed to proceed.     HPI: Jennifer Spencer is a 52 y.o. female who presents for colonoscopy for colon cancer screening. Denies blood in stools, changes in bowel habits, or unintentional weight loss. Denies family history of colon cancer.  Past Medical History:  Diagnosis Date   Seasonal allergies    Seizures (HCC)    last episode 2001-stopped medication 2014    Past Surgical History:  Procedure Laterality Date   CHOLECYSTECTOMY      Prior to Admission medications   Medication Sig Start Date End Date Taking? Authorizing Provider  cyclobenzaprine (FLEXERIL) 10 MG tablet Take 10 mg by mouth 3 (three) times daily as needed for muscle spasms. 07/04/23  Yes [provider]  Loratadine (CLARITIN PO) Take 1 tablet by mouth daily at 6 (six) AM.   Yes [provider]  Misc Natural Products (LUTEIN 20 PO) Take 1 tablet by mouth daily.   Yes [provider]  Multiple Vitamin (MULTIVITAMIN PO) Take 1 tablet by mouth daily at 6 (six) AM.   Yes [provider]  ibuprofen (ADVIL) 800 MG tablet Take 800 mg by mouth 3 (three) times daily.    [provider]    Current Outpatient Medications  Medication Sig Dispense Refill   cyclobenzaprine (FLEXERIL) 10 MG tablet Take 10 mg by mouth 3 (three) times daily as needed for muscle spasms.     Loratadine (CLARITIN PO) Take 1 tablet by mouth daily at 6 (six) AM.     Misc Natural Products (LUTEIN 20 PO) Take 1 tablet by mouth daily.      Multiple Vitamin (MULTIVITAMIN PO) Take 1 tablet by mouth daily at 6 (six) AM.     ibuprofen (ADVIL) 800 MG tablet Take 800 mg by mouth 3 (three) times daily.     Current Facility-Administered Medications  Medication Dose Route Frequency Provider Last Rate Last Admin   0.9 %  sodium chloride infusion  500 mL Intravenous Continuous Imogene Burn, MD        Allergies as of 09/06/2023 - Review Complete 09/06/2023  Allergen Reaction Noted   Fexofenadine Nausea Only, Other (See Comments), and Shortness Of Breath 11/08/2015   Mushroom extract complex Anaphylaxis 11/19/2020   Aspirin Other (See Comments) 11/19/2020   Bactrim [sulfamethoxazole-trimethoprim] Other (See Comments) 11/19/2020   Codeine Other (See Comments) 09/12/2020   Depakote [divalproex sodium] Other (See Comments) 09/12/2020   Gabapentin Other (See Comments) 01/19/2018   Penicillins Other (See Comments) 09/12/2020   Tequin [gatifloxacin] Other (See Comments) 11/19/2020   Vicodin [hydrocodone-acetaminophen] Other (See Comments) 09/12/2020   Percocet [oxycodone-acetaminophen] Diarrhea and Rash 11/19/2020   Pine tar Itching and Rash 06/09/2023    Family History  Problem Relation Age of Onset   Colon polyps Neg Hx    Colon cancer Neg Hx    Esophageal cancer Neg Hx    Rectal cancer Neg Hx    Stomach cancer Neg Hx     Social History   Socioeconomic History   Marital status: Married    Spouse  name: Not on file   Number of children: Not on file   Years of education: Not on file   Highest education level: Not on file  Occupational History   Not on file  Tobacco Use   Smoking status: Former   Smokeless tobacco: Never  Vaping Use   Vaping status: Never Used  Substance and Sexual Activity   Alcohol use: Not Currently   Drug use: Not Currently   Sexual activity: Not on file  Other Topics Concern   Not on file  Social History Narrative   Not on file   Social Determinants of Health   Financial Resource Strain:  Not on file  Food Insecurity: Not on file  Transportation Needs: Not on file  Physical Activity: Not on file  Stress: Not on file  Social Connections: Not on file  Intimate Partner Violence: Not on file    Physical Exam: Vital signs in last 24 hours: BP (!) 149/88   Pulse 79   Temp (!) 97.3 F (36.3 C)   Ht 5\' 7"  (1.702 m)   Wt 276 lb (125.2 kg)   SpO2 100%   BMI 43.23 kg/m  GEN: NAD EYE: Sclerae anicteric ENT: MMM CV: Non-tachycardic Pulm: No increased work of breathing GI: Soft, NT/ND NEURO:  Alert & Oriented   [name], MD [facility]  09/06/2023 8:06 AM

## 2023-09-06 NOTE — Op Note (Signed)
[facility] Patient Name: Jennifer Spencer Procedure Date: 09/06/2023 8:08 AM MRN: [id number] Endoscopist: Madelyn Brunner Hillman , , [id number] Age: 52 Referring MD:  Date of Birth: 1971-08-01 Gender: Female Account #: [id number] Procedure:                Colonoscopy Indications:              Screening for colorectal malignant neoplasm, This                            is the patient's first colonoscopy Medicines:                Monitored Anesthesia Care Procedure:                Pre-Anesthesia Assessment:                           - Prior to the procedure, a History and Physical                            was performed, and patient medications and                            allergies were reviewed. The patient's tolerance of                            previous anesthesia was also reviewed. The risks                            and benefits of the procedure and the sedation                            options and risks were discussed with the patient.                            All questions were answered, and informed consent                            was obtained. Prior Anticoagulants: The patient has                            taken no anticoagulant or antiplatelet agents. ASA                            Grade Assessment: III - A patient with severe                            systemic disease. After reviewing the risks and                            benefits, the patient was deemed in satisfactory                            condition to undergo the procedure.  After obtaining informed consent, the colonoscope                            was passed under direct vision. Throughout the                            procedure, the patient's blood pressure, pulse, and                            oxygen saturations were monitored continuously. The                            Olympus Scope SN: [id number] was introduced through                            the anus and  advanced to the the terminal ileum.                            The colonoscopy was performed without difficulty.                            The patient tolerated the procedure well. The                            quality of the bowel preparation was excellent. The                            terminal ileum, ileocecal valve, appendiceal                            orifice, and rectum were photographed. Scope In: 8:12:15 AM Scope Out: 8:25:43 AM Scope Withdrawal Time: 0 hours 8 minutes 58 seconds  Total Procedure Duration: 0 hours 13 minutes 28 seconds  Findings:                 The terminal ileum appeared normal.                           Non-bleeding internal hemorrhoids were found during                            retroflexion.                           The exam was otherwise without abnormality. Complications:            No immediate complications. Estimated Blood Loss:     Estimated blood loss: none. Impression:               - The examined portion of the ileum was normal.                           - Non-bleeding internal hemorrhoids.                           - The examination was otherwise normal.                           -  No specimens collected. Recommendation:           - Discharge patient to home (with escort).                           - Repeat colonoscopy in 10 years for screening                            purposes.                           - The findings and recommendations were discussed                            with the patient. Dr Particia Lather "Alan Ripper" Leonides Schanz,  09/06/2023 8:33:41 AM

## 2023-09-06 NOTE — Patient Instructions (Signed)
Read your discharge instructions.  Resume all of your previous medications today as ordered.  YOU HAD AN ENDOSCOPIC PROCEDURE TODAY AT THE [facility]:   Refer to the procedure report that was given to you for any specific questions about what was found during the examination.  If the procedure report does not answer your questions, please call your gastroenterologist to clarify.  If you requested that your care partner not be given the details of your procedure findings, then the procedure report has been included in a sealed envelope for you to review at your convenience later.  YOU SHOULD EXPECT: Some feelings of bloating in the abdomen. Passage of more gas than usual.  Walking can help get rid of the air that was put into your GI tract during the procedure and reduce the bloating. If you had a lower endoscopy (such as a colonoscopy or flexible sigmoidoscopy) you may notice spotting of blood in your stool or on the toilet paper. If you underwent a bowel prep for your procedure, you may not have a normal bowel movement for a few days.  Please Note:  You might notice some irritation and congestion in your nose or some drainage.  This is from the oxygen used during your procedure.  There is no need for concern and it should clear up in a day or so.  SYMPTOMS TO REPORT IMMEDIATELY:  Following lower endoscopy (colonoscopy or flexible sigmoidoscopy):  Excessive amounts of blood in the stool  Significant tenderness or worsening of abdominal pains  Swelling of the abdomen that is new, acute  Fever of 100F or higher   For urgent or emergent issues, a gastroenterologist can be reached at any hour by calling [phone]. Do not use MyChart messaging for urgent concerns.    DIET:  We do recommend a small meal at first, but then you may proceed to your regular diet.  Drink plenty of fluids but you should avoid alcoholic beverages for 24 hours. Try to increase the fiber in your diet, and  drink plenty of water.  ACTIVITY:  You should plan to take it easy for the rest of today and you should NOT DRIVE or use heavy machinery until tomorrow (because of the sedation medicines used during the test).    FOLLOW UP: Our staff will call the number listed on your records the next business day following your procedure.  We will call around 7:15- 8:00 am to check on you and address any questions or concerns that you may have regarding the information given to you following your procedure. If we do not reach you, we will leave a message.      SIGNATURES/CONFIDENTIALITY: You and/or your care partner have signed paperwork which will be entered into your electronic medical record.  These signatures attest to the fact that that the information above on your After Visit Summary has been reviewed and is understood.  Full responsibility of the confidentiality of this discharge information lies with you and/or your care-partner.

## 2023-09-07 ENCOUNTER — Telehealth: Payer: Self-pay

## 2023-09-07 NOTE — Telephone Encounter (Signed)
Left message on follow up call. 

## 2024-01-28 IMAGING — CT CT ABD-PELV W/ CM
2 of 4 series · 17 of 46 positions shown, 19 images · IV contrast (OMNIPAQUE 300)
Comparison: 02/02/2022

CLINICAL DATA: Acute abdominal pain and cramping. Nausea. Heavy
menses.

EXAM:
CT ABDOMEN AND PELVIS WITH CONTRAST
TECHNIQUE: Multidetector CT imaging of the abdomen and pelvis was performed
using the standard protocol following bolus administration of
intravenous contrast.

[Series 2: axial st · axial · 0.98mm/px · z∈[+1183,+1643]mm · 14 of 102 slices shown, 16 images]
[im 5/102  soft-tissue]
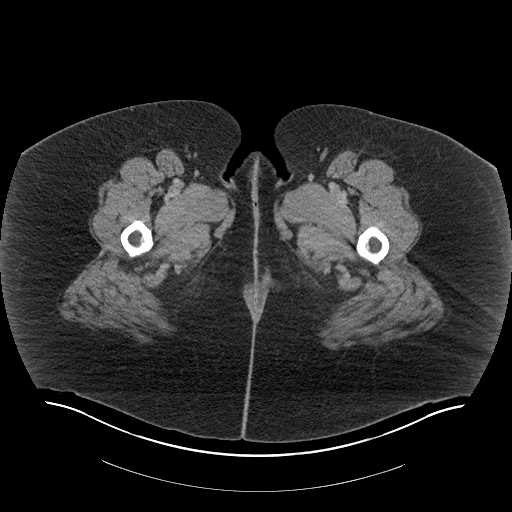
[im 5/102  bone]
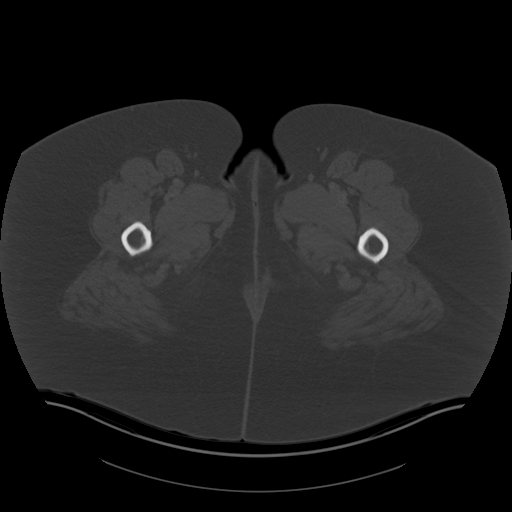
[im 14/102  soft-tissue]
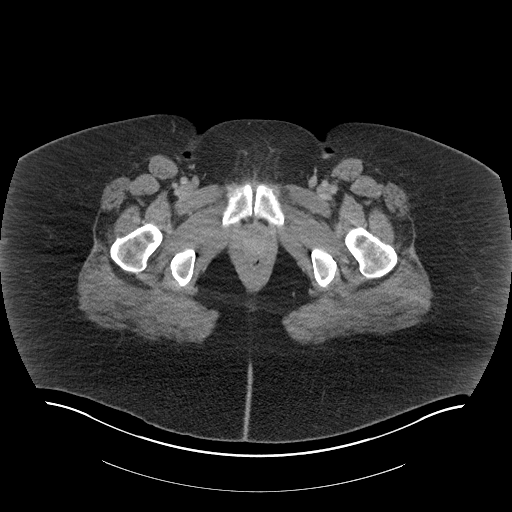
[im 18/102  soft-tissue]
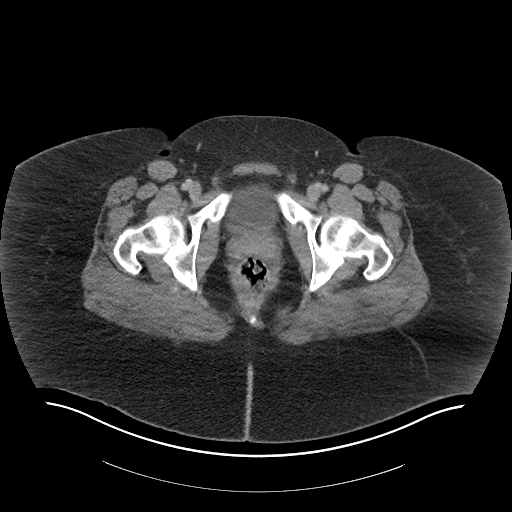
[im 27/102  soft-tissue]
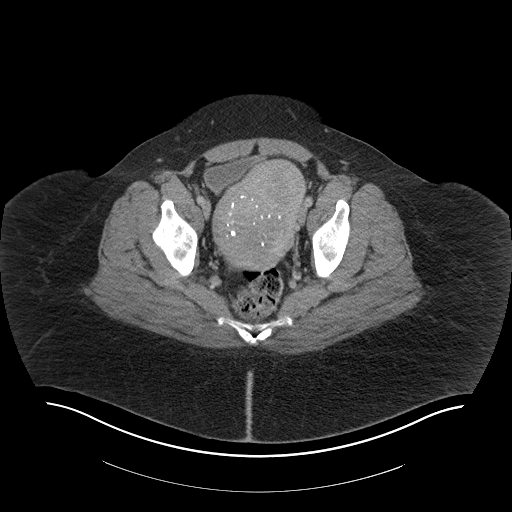
[im 36/102  soft-tissue]
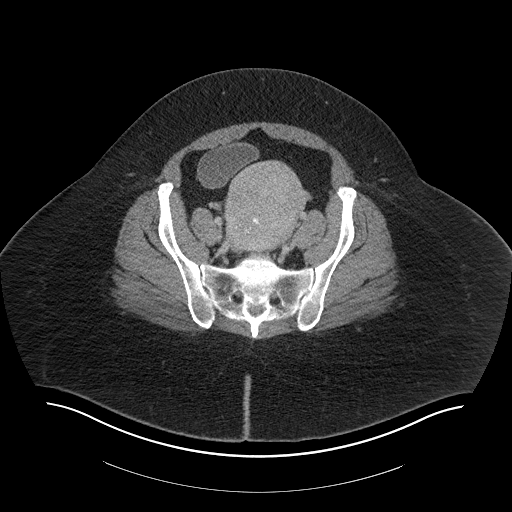
[im 40/102  soft-tissue]
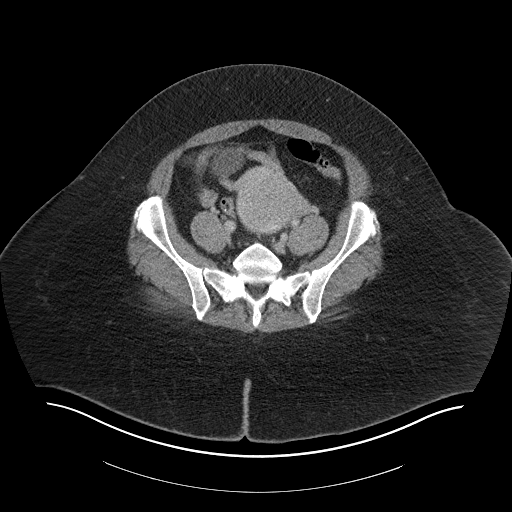
[im 49/102  soft-tissue]
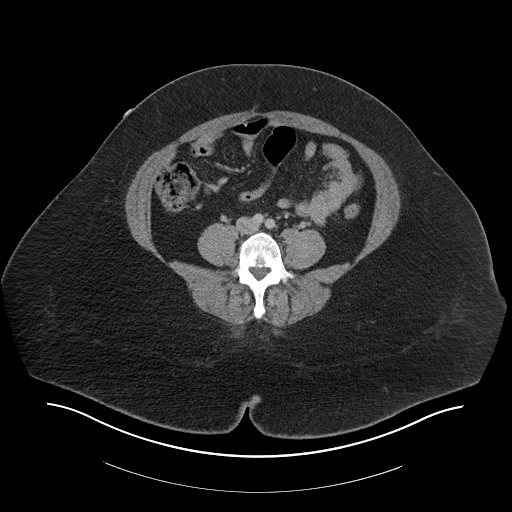
[im 53/102  soft-tissue]
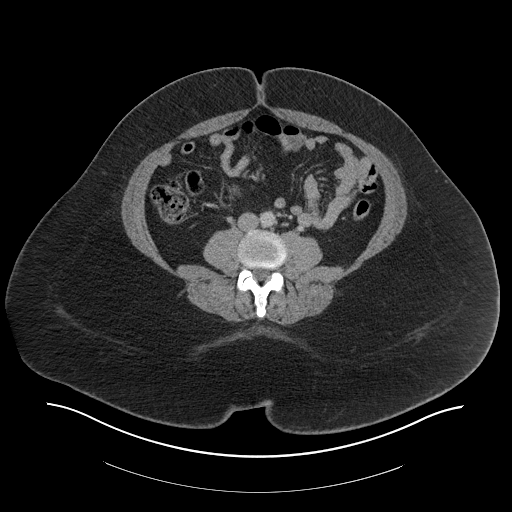
[im 62/102  soft-tissue]
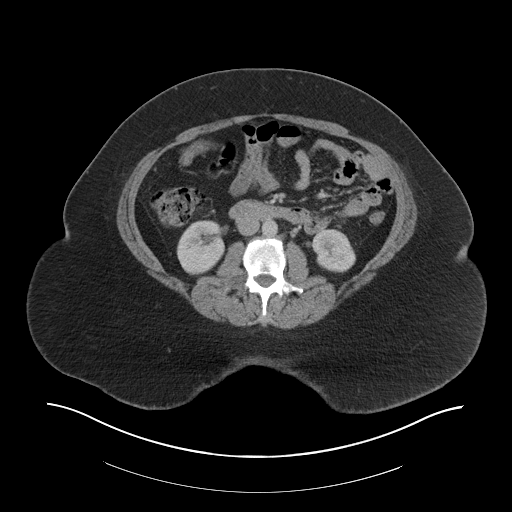
[im 62/102  bone]
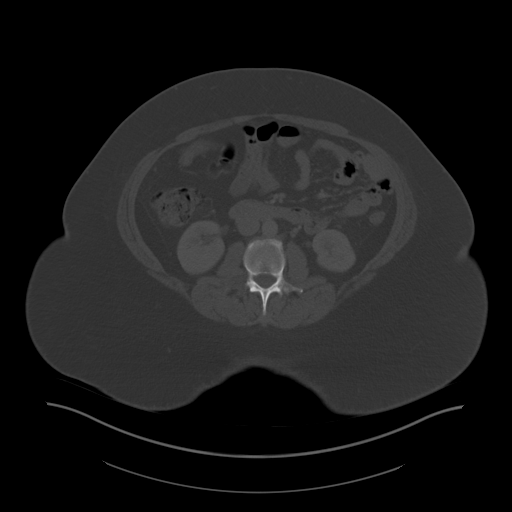
[im 66/102  soft-tissue]
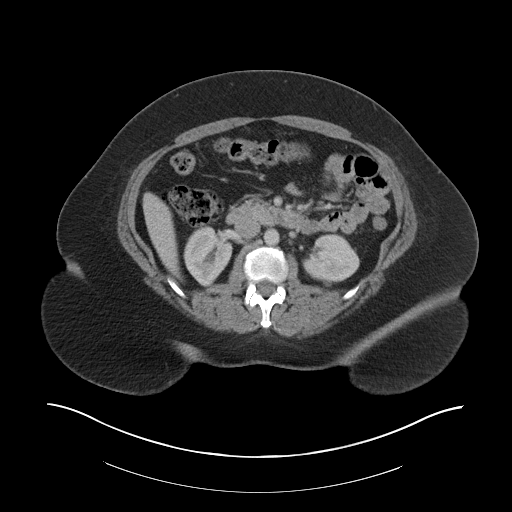
[im 75/102  soft-tissue]
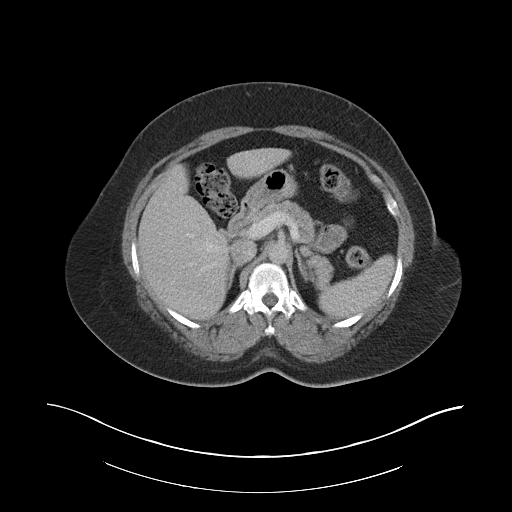
[im 84/102  soft-tissue]
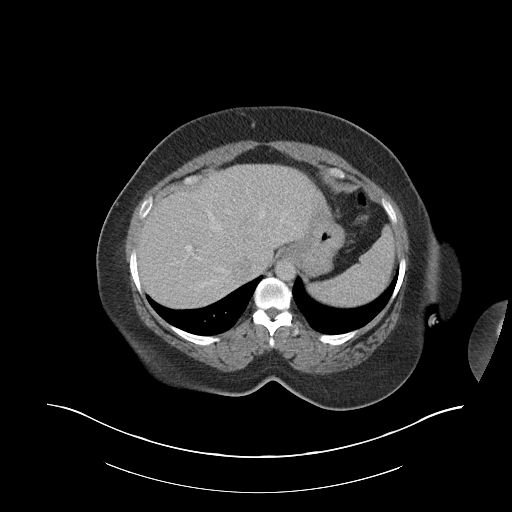
[im 88/102  soft-tissue]
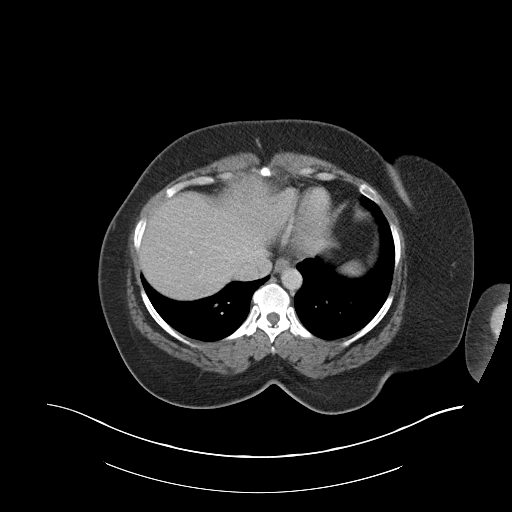
[im 97/102  soft-tissue]
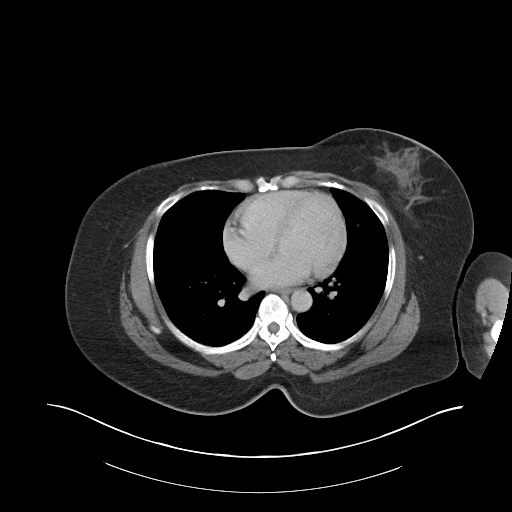

[Series 4: coronal st · coronal · 0.99mm/px · 3 of 188 slices shown]
[im 63/188  soft-tissue]
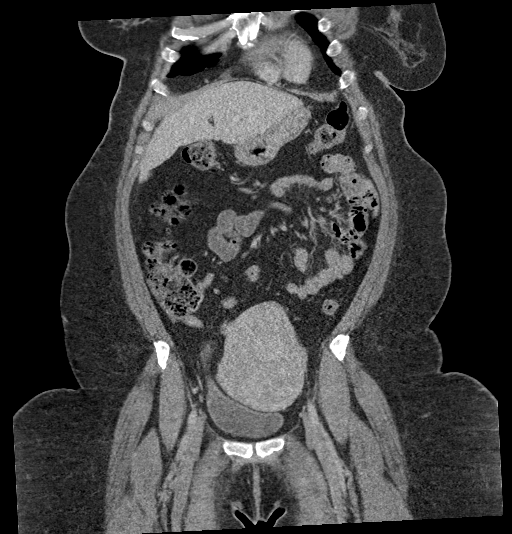
[im 84/188  soft-tissue]
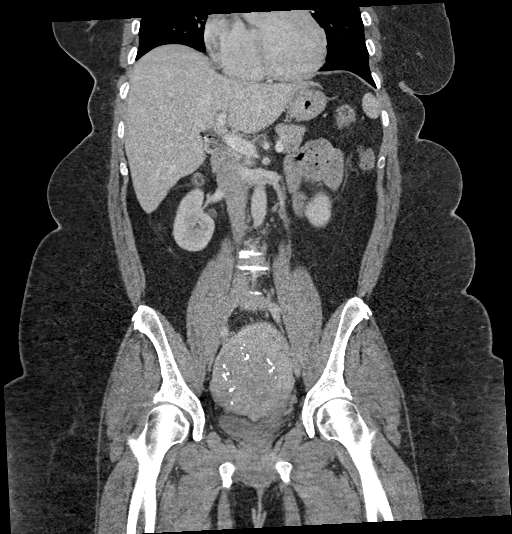
[im 104/188  soft-tissue]
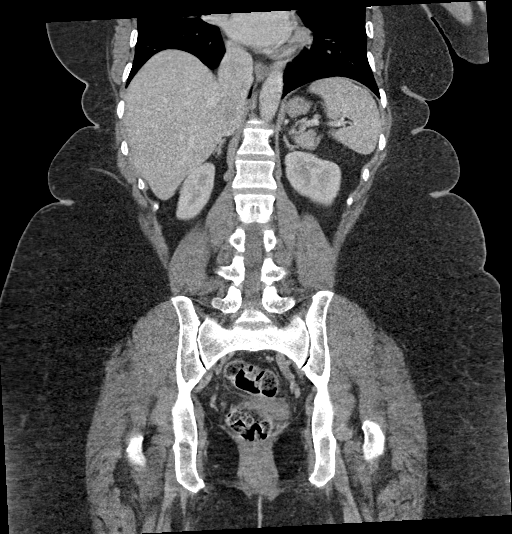

[17 of 46 positions shown; findings below may reference images not displayed]

RADIATION DOSE REDUCTION: This exam was performed according to the
departmental dose-optimization program which includes automated
exposure control, adjustment of the mA and/or kV according to
patient size and/or use of iterative reconstruction technique.

CONTRAST:  100mL OMNIPAQUE IOHEXOL 300 MG/ML  SOLN
FINDINGS: Lower Chest: No acute findings.

Hepatobiliary: No hepatic masses identified. Prior cholecystectomy.
No evidence of biliary obstruction.

Pancreas:  No mass or inflammatory changes.

Spleen: Within normal limits in size and appearance.

Adrenals/Urinary Tract: Sub-cm low-attenuation lesion in the
posterior midpole the left kidney is too small to characterize but
remains stable since previous study. No evidence of ureteral calculi
or hydronephrosis.

Stomach/Bowel: No evidence of obstruction, inflammatory process or
abnormal fluid collections. Normal appendix visualized.

Vascular/Lymphatic: No pathologically enlarged lymph nodes. No acute
vascular findings.

Reproductive: Large uterine fibroids are again seen, largest of
which is partially calcified and measures approximately 7 cm in
diameter. Adnexal regions are unremarkable.

Other:  None.

Musculoskeletal:  No suspicious bone lesions identified.
IMPRESSION: No acute findings within the abdomen or pelvis.

Stable uterine fibroids, largest measuring approximately 7 cm.

## 2024-07-31 ENCOUNTER — Telehealth: Payer: Self-pay | Admitting: Podiatry

## 2024-07-31 NOTE — Telephone Encounter (Signed)
 Left voicemail confirming appointment day and time.

## 2024-08-01 NOTE — Telephone Encounter (Signed)
 Received a CMM document requesting a P.A. Lidocaine  5% patches for this pt.   P.A. has been initiated, just awaiting approval or denial.  (Key: [facility]) PA Case ID #: [id number] Rx #: [id number]   DO NOT CLOSE THIS ENCOUNTER!

## 2024-08-01 NOTE — Telephone Encounter (Signed)
 [facility] Medicaid has denied the Lidocaine  5% patches.

## 2024-08-03 ENCOUNTER — Ambulatory Visit: Admitting: Podiatry

## 2024-08-07 ENCOUNTER — Encounter: Payer: Self-pay | Admitting: Podiatry

## 2024-08-07 ENCOUNTER — Ambulatory Visit (INDEPENDENT_AMBULATORY_CARE_PROVIDER_SITE_OTHER): Admitting: Podiatry

## 2024-08-07 ENCOUNTER — Ambulatory Visit (INDEPENDENT_AMBULATORY_CARE_PROVIDER_SITE_OTHER)

## 2024-08-07 DIAGNOSIS — M779 Enthesopathy, unspecified: Secondary | ICD-10-CM

## 2024-08-07 DIAGNOSIS — M7751 Other enthesopathy of right foot: Secondary | ICD-10-CM | POA: Diagnosis not present

## 2024-08-07 DIAGNOSIS — G5791 Unspecified mononeuropathy of right lower limb: Secondary | ICD-10-CM

## 2024-08-07 MED ORDER — LIDOCAINE 5 % EX PTCH: 1.0000 | MEDICATED_PATCH | CUTANEOUS | 0 refills | Status: AC

## 2024-08-07 NOTE — Progress Notes (Signed)
  Subjective:  Patient ID: Jennifer Spencer, female    DOB: September 21, 1971,   MRN: [id number]  Chief Complaint  Patient presents with   Foot Pain    I'm having spasms with it.  I'm not able to walk or stand too long.  A friend gave me some lidocaine  patches and that seems to help for up to six hours.  The Flexeril only makes me sleepy.  [employer] said I need a second opinion an try other treatments before they will cover the Lidocaine  Patches.    53 y.o. female presents for concern as above. This has been ongoing for quite some time. Relates sprained her ankle initially a year ago and then this past April reinjured the ankle and was told it was broken. Has been following with Dr. Case with [facility]. Recently had MRI that was negative.  She has been using lidocaine  patches with seem to help. She has continued in a CAM boot.  Relates shooting pain that go up the leg as well. Has been in PT. Denies any other pedal complaints. Denies n/v/f/c.   Past Medical History:  Diagnosis Date   Seasonal allergies    Seizures (HCC)    last episode 2001-stopped medication 2014    Objective:  Physical Exam: Vascular: DP/PT pulses 2/4 bilateral. CFT <3 seconds. Normal hair growth on digits. No edema.  Skin. No lacerations or abrasions bilateral feet.  Musculoskeletal: MMT 5/5 bilateral lower extremities in DF, PF, Inversion and Eversion. Deceased ROM in DF of ankle joint. Sensitive along inner arch of foot and dorsum of foot as well as anterior ankle. No major pinpoint areas of tenderness.  Neurological: Sensation intact to light touch.   Reviewed previous MRI and No remarkable changes.   Assessment:   1. Neuritis of right foot      Plan:  Patient was evaluated and treated and all questions answered. -Xrays reviewed. No acute fractures or dislocations noted.  -Reviewed notes from previous physician.  Discussed ankle injury and possible neuritis  and etiology as well as treatment with patient.   Radiographs reviewed and discussed with patient.  -Discussed and educated patient on foot care, especially with  regards to the vascular, neurological and musculoskeletal systems.  -Continue in CAM boot  -Will order lidocaine  patches as this seems to be the only helpful thing.  -Continue PT.  -Would like for her to be evaluated by neurology for possble NCV as this could be a nerve irritation from the intiial injury that is not improving. Also has history of lower back pain and possible radiculopathy.  -Referral sent.  -Patient to return as needed   Asberry Failing, DPM

## 2024-10-09 ENCOUNTER — Ambulatory Visit (INDEPENDENT_AMBULATORY_CARE_PROVIDER_SITE_OTHER): Admitting: Podiatry

## 2024-10-09 DIAGNOSIS — Z91199 Patient's noncompliance with other medical treatment and regimen due to unspecified reason: Secondary | ICD-10-CM

## 2024-10-09 NOTE — Progress Notes (Signed)
 Cancel 24 hours

## 2024-10-16 ENCOUNTER — Ambulatory Visit: Admitting: Podiatry

## 2024-10-16 ENCOUNTER — Encounter: Payer: Self-pay | Admitting: Podiatry

## 2024-10-16 DIAGNOSIS — G5791 Unspecified mononeuropathy of right lower limb: Secondary | ICD-10-CM | POA: Diagnosis not present

## 2024-10-16 MED ORDER — GABAPENTIN 300 MG PO CAPS: 300.0000 mg | ORAL_CAPSULE | Freq: Every day | ORAL | 3 refills | Status: AC

## 2024-10-16 NOTE — Progress Notes (Signed)
  Subjective:  Patient ID: Jennifer Spencer, female    DOB: 02/10/1971,   MRN: [id number]  Chief Complaint  Patient presents with   Foot Pain    I been having issues with the pain and the spasms.  I chose to come back here because the other place only wants to give Flexeril.  I went to the emergency room and they were supposed to send in a prescription for Meloxicam.  It becomes hard for me to walk after standing or sitting too long.    53 y.o. female presents for follow-up of neuritis of her right foot.  Relates she has continued to have pain and spasms going up her leg.  She is here because all she has been given by previous providers is Flexeril.  She relates it is still hard to walk and sit or stand for too long.  She has had gabapentin  and this has helped some.  Flexeril causes changes to her mind and causes her to sleep essentially.  Denies any other pedal complaints. Denies n/v/f/c.   Past Medical History:  Diagnosis Date   Seasonal allergies    Seizures (HCC)    last episode 2001-stopped medication 2014    Objective:  Physical Exam: Vascular: DP/PT pulses 2/4 bilateral. CFT <3 seconds. Normal hair growth on digits. No edema.  Skin. No lacerations or abrasions bilateral feet.  Musculoskeletal: MMT 5/5 bilateral lower extremities in DF, PF, Inversion and Eversion. Deceased ROM in DF of ankle joint. Sensitive along inner arch of foot and dorsum of foot as well as anterior ankle. No major pinpoint areas of tenderness.  Neurological: Sensation intact to light touch.   Reviewed previous MRI and No remarkable changes.   Assessment:   1. Neuritis of right foot       Plan:  Patient was evaluated and treated and all questions answered. -Xrays reviewed. No acute fractures or dislocations noted.  -Reviewed notes from previous physician.  Discussed ankle injury and possible neuritis  and etiology as well as treatment with patient.  Radiographs reviewed and discussed with  patient.  -Discussed and educated patient on foot care, especially with  regards to the vascular, neurological and musculoskeletal systems.  -Will order lidocaine  patches as this seems to be the only helpful thing.  -Gabapentin  sent to the pharmacy as this has helped in the past for her. -Continue PT.  -Ankle brace dispensed to transition out of cam boot. -Would like for her to be evaluated by neurology for possble NCV as this could be a nerve irritation from the intiial injury that is not improving. Also has history of lower back pain and possible radiculopathy.  -Referral sent.  To get NCV ordered. -Patient to return after NCV   Asberry Failing, DPM
